# Patient Record
Sex: Female | Born: 1985 | Race: White | Hispanic: No | Marital: Single | Smoking: Former smoker
Health system: Southern US, Community
[De-identification: ages and names within clinical notes are randomized; demographics above are authoritative.]

## PROBLEM LIST (undated history)

## (undated) DIAGNOSIS — N289 Disorder of kidney and ureter, unspecified: Secondary | ICD-10-CM

## (undated) DIAGNOSIS — O139 Gestational [pregnancy-induced] hypertension without significant proteinuria, unspecified trimester: Secondary | ICD-10-CM

## (undated) DIAGNOSIS — M542 Cervicalgia: Secondary | ICD-10-CM

## (undated) DIAGNOSIS — B977 Papillomavirus as the cause of diseases classified elsewhere: Secondary | ICD-10-CM

## (undated) DIAGNOSIS — N200 Calculus of kidney: Secondary | ICD-10-CM

## (undated) DIAGNOSIS — M549 Dorsalgia, unspecified: Secondary | ICD-10-CM

## (undated) DIAGNOSIS — F209 Schizophrenia, unspecified: Secondary | ICD-10-CM

## (undated) DIAGNOSIS — F17200 Nicotine dependence, unspecified, uncomplicated: Secondary | ICD-10-CM

## (undated) DIAGNOSIS — R45851 Suicidal ideations: Secondary | ICD-10-CM

## (undated) DIAGNOSIS — R809 Proteinuria, unspecified: Secondary | ICD-10-CM

## (undated) HISTORY — PX: UPPER GI ENDOSCOPY: SHX6162

## (undated) HISTORY — DX: Proteinuria, unspecified: R80.9

## (undated) HISTORY — DX: Cervicalgia: M54.2

## (undated) HISTORY — PX: BREAST REDUCTION SURGERY: SHX8

## (undated) HISTORY — DX: Nicotine dependence, unspecified, uncomplicated: F17.200

## (undated) HISTORY — PX: LEEP: SHX91

## (undated) HISTORY — DX: Gestational (pregnancy-induced) hypertension without significant proteinuria, unspecified trimester: O13.9

## (undated) HISTORY — DX: Dorsalgia, unspecified: M54.9

## (undated) HISTORY — PX: WISDOM TOOTH EXTRACTION: SHX21

## (undated) HISTORY — DX: Suicidal ideations: R45.851

---
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
## 2007-01-01 ENCOUNTER — Ambulatory Visit: Payer: Self-pay | Admitting: Internal Medicine

## 2007-01-18 ENCOUNTER — Telehealth: Payer: Self-pay | Admitting: Internal Medicine

## 2007-01-21 ENCOUNTER — Ambulatory Visit (HOSPITAL_COMMUNITY): Payer: Self-pay | Admitting: Marriage and Family Therapist

## 2007-01-28 ENCOUNTER — Ambulatory Visit (HOSPITAL_COMMUNITY): Payer: Self-pay | Admitting: Marriage and Family Therapist

## 2007-02-11 ENCOUNTER — Ambulatory Visit (HOSPITAL_COMMUNITY): Payer: Self-pay | Admitting: Marriage and Family Therapist

## 2007-02-18 ENCOUNTER — Ambulatory Visit (HOSPITAL_COMMUNITY): Payer: Self-pay | Admitting: Marriage and Family Therapist

## 2007-02-25 ENCOUNTER — Ambulatory Visit (HOSPITAL_COMMUNITY): Payer: Self-pay | Admitting: Marriage and Family Therapist

## 2007-03-04 ENCOUNTER — Ambulatory Visit (HOSPITAL_COMMUNITY): Payer: Self-pay | Admitting: Marriage and Family Therapist

## 2007-03-15 ENCOUNTER — Ambulatory Visit (HOSPITAL_COMMUNITY): Payer: Self-pay | Admitting: Marriage and Family Therapist

## 2007-04-09 ENCOUNTER — Ambulatory Visit (HOSPITAL_COMMUNITY): Payer: Self-pay | Admitting: Marriage and Family Therapist

## 2007-04-22 ENCOUNTER — Ambulatory Visit (HOSPITAL_COMMUNITY): Payer: Self-pay | Admitting: Marriage and Family Therapist

## 2007-05-13 ENCOUNTER — Ambulatory Visit (HOSPITAL_COMMUNITY): Payer: Self-pay | Admitting: Marriage and Family Therapist

## 2007-05-24 ENCOUNTER — Ambulatory Visit (HOSPITAL_COMMUNITY): Payer: Self-pay | Admitting: Marriage and Family Therapist

## 2007-06-07 ENCOUNTER — Other Ambulatory Visit: Admission: RE | Admit: 2007-06-07 | Discharge: 2007-06-07 | Payer: Self-pay | Admitting: Gynecology

## 2007-10-01 ENCOUNTER — Ambulatory Visit (HOSPITAL_COMMUNITY): Payer: Self-pay | Admitting: Marriage and Family Therapist

## 2007-10-17 DIAGNOSIS — R45851 Suicidal ideations: Secondary | ICD-10-CM

## 2007-10-17 HISTORY — DX: Suicidal ideations: R45.851

## 2007-10-18 ENCOUNTER — Ambulatory Visit (HOSPITAL_COMMUNITY): Payer: Self-pay | Admitting: Marriage and Family Therapist

## 2007-10-23 ENCOUNTER — Ambulatory Visit (HOSPITAL_COMMUNITY): Payer: Self-pay | Admitting: Marriage and Family Therapist

## 2007-11-03 ENCOUNTER — Ambulatory Visit: Payer: Self-pay | Admitting: Psychiatry

## 2007-11-03 ENCOUNTER — Inpatient Hospital Stay (HOSPITAL_COMMUNITY): Admission: AD | Admit: 2007-11-03 | Discharge: 2007-11-10 | Payer: Self-pay | Admitting: Psychiatry

## 2007-11-11 ENCOUNTER — Other Ambulatory Visit (HOSPITAL_COMMUNITY): Admission: RE | Admit: 2007-11-11 | Discharge: 2007-11-26 | Payer: Self-pay | Admitting: Psychiatry

## 2007-11-15 ENCOUNTER — Telehealth (INDEPENDENT_AMBULATORY_CARE_PROVIDER_SITE_OTHER): Payer: Self-pay | Admitting: *Deleted

## 2007-11-19 ENCOUNTER — Telehealth: Payer: Self-pay | Admitting: Internal Medicine

## 2007-11-20 ENCOUNTER — Emergency Department (HOSPITAL_COMMUNITY): Admission: EM | Admit: 2007-11-20 | Discharge: 2007-11-20 | Payer: Self-pay | Admitting: Emergency Medicine

## 2007-11-20 ENCOUNTER — Telehealth: Payer: Self-pay | Admitting: Internal Medicine

## 2007-11-26 ENCOUNTER — Ambulatory Visit (HOSPITAL_COMMUNITY): Payer: Self-pay | Admitting: Psychiatry

## 2007-11-26 ENCOUNTER — Emergency Department (HOSPITAL_COMMUNITY): Admission: EM | Admit: 2007-11-26 | Discharge: 2007-11-26 | Payer: Self-pay | Admitting: Emergency Medicine

## 2007-12-09 ENCOUNTER — Encounter
Admission: RE | Admit: 2007-12-09 | Discharge: 2007-12-19 | Payer: Self-pay | Admitting: Physical Medicine & Rehabilitation

## 2007-12-10 ENCOUNTER — Ambulatory Visit: Payer: Self-pay | Admitting: Physical Medicine & Rehabilitation

## 2007-12-17 ENCOUNTER — Ambulatory Visit: Payer: Self-pay | Admitting: Internal Medicine

## 2007-12-17 DIAGNOSIS — M542 Cervicalgia: Secondary | ICD-10-CM

## 2007-12-17 DIAGNOSIS — F17200 Nicotine dependence, unspecified, uncomplicated: Secondary | ICD-10-CM

## 2007-12-17 DIAGNOSIS — Z9189 Other specified personal risk factors, not elsewhere classified: Secondary | ICD-10-CM | POA: Insufficient documentation

## 2007-12-17 HISTORY — DX: Nicotine dependence, unspecified, uncomplicated: F17.200

## 2007-12-17 HISTORY — DX: Cervicalgia: M54.2

## 2007-12-18 ENCOUNTER — Ambulatory Visit (HOSPITAL_COMMUNITY): Admission: RE | Admit: 2007-12-18 | Discharge: 2007-12-18 | Payer: Self-pay | Admitting: Orthopedic Surgery

## 2007-12-18 ENCOUNTER — Emergency Department (HOSPITAL_COMMUNITY): Admission: EM | Admit: 2007-12-18 | Discharge: 2007-12-18 | Payer: Self-pay | Admitting: Emergency Medicine

## 2007-12-19 ENCOUNTER — Ambulatory Visit: Payer: Self-pay | Admitting: Physical Medicine & Rehabilitation

## 2008-01-08 ENCOUNTER — Ambulatory Visit (HOSPITAL_COMMUNITY): Payer: Self-pay | Admitting: Psychiatry

## 2008-01-17 DIAGNOSIS — O139 Gestational [pregnancy-induced] hypertension without significant proteinuria, unspecified trimester: Secondary | ICD-10-CM

## 2008-01-17 HISTORY — DX: Gestational (pregnancy-induced) hypertension without significant proteinuria, unspecified trimester: O13.9

## 2008-02-10 ENCOUNTER — Telehealth: Payer: Self-pay | Admitting: Internal Medicine

## 2008-02-12 ENCOUNTER — Ambulatory Visit: Payer: Self-pay | Admitting: Women's Health

## 2008-06-12 ENCOUNTER — Ambulatory Visit (HOSPITAL_COMMUNITY): Payer: Self-pay | Admitting: Psychiatry

## 2008-09-18 ENCOUNTER — Ambulatory Visit (HOSPITAL_COMMUNITY): Payer: Self-pay | Admitting: Psychiatry

## 2009-01-22 ENCOUNTER — Ambulatory Visit: Payer: Self-pay | Admitting: Women's Health

## 2009-01-22 ENCOUNTER — Other Ambulatory Visit: Admission: RE | Admit: 2009-01-22 | Discharge: 2009-01-22 | Payer: Self-pay | Admitting: Gynecology

## 2009-02-10 ENCOUNTER — Ambulatory Visit: Payer: Self-pay | Admitting: Internal Medicine

## 2009-06-28 ENCOUNTER — Ambulatory Visit: Payer: Self-pay | Admitting: Women's Health

## 2009-10-06 ENCOUNTER — Ambulatory Visit: Payer: Self-pay | Admitting: Family Medicine

## 2009-12-07 ENCOUNTER — Ambulatory Visit: Payer: Self-pay | Admitting: Internal Medicine

## 2009-12-27 ENCOUNTER — Ambulatory Visit: Payer: Self-pay | Admitting: Women's Health
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
## 2010-02-20 ENCOUNTER — Emergency Department (HOSPITAL_COMMUNITY)
Admission: EM | Admit: 2010-02-20 | Discharge: 2010-02-20 | Disposition: A | Discharge status: Home / Self Care | Payer: 59 | Attending: Emergency Medicine | Admitting: Emergency Medicine

## 2010-02-20 ENCOUNTER — Emergency Department (HOSPITAL_COMMUNITY): Payer: 59

## 2010-02-20 DIAGNOSIS — F411 Generalized anxiety disorder: Secondary | ICD-10-CM | POA: Insufficient documentation

## 2010-02-20 DIAGNOSIS — E039 Hypothyroidism, unspecified: Secondary | ICD-10-CM | POA: Insufficient documentation

## 2010-02-20 DIAGNOSIS — R079 Chest pain, unspecified: Secondary | ICD-10-CM | POA: Insufficient documentation

## 2010-02-20 DIAGNOSIS — R002 Palpitations: Secondary | ICD-10-CM | POA: Insufficient documentation

## 2010-02-20 DIAGNOSIS — Z79899 Other long term (current) drug therapy: Secondary | ICD-10-CM | POA: Insufficient documentation

## 2010-02-20 LAB — POCT I-STAT, CHEM 8
BUN: 13 mg/dL (ref 6–23)
Calcium, Ion: 1.19 mmol/L (ref 1.12–1.32)
Chloride: 103 mEq/L (ref 96–112)
HCT: 44 % (ref 36.0–46.0)
Potassium: 3.2 mEq/L — ABNORMAL LOW (ref 3.5–5.1)

## 2010-02-20 LAB — D-DIMER, QUANTITATIVE: D-Dimer, Quant: <0.22 ug/mL-FEU (ref 0.00–0.48)

## 2010-02-23 ENCOUNTER — Inpatient Hospital Stay (INDEPENDENT_AMBULATORY_CARE_PROVIDER_SITE_OTHER)
Admission: RE | Admit: 2010-02-23 | Discharge: 2010-02-23 | Disposition: A | Discharge status: Home / Self Care | Payer: 59 | Source: Ambulatory Visit | Attending: Family Medicine | Admitting: Family Medicine

## 2010-02-23 DIAGNOSIS — R002 Palpitations: Secondary | ICD-10-CM

## 2010-03-09 ENCOUNTER — Ambulatory Visit (HOSPITAL_COMMUNITY)
Admission: RE | Admit: 2010-03-09 | Discharge: 2010-03-09 | Disposition: A | Discharge status: Home / Self Care | Payer: Self-pay | Source: Ambulatory Visit | Attending: Gastroenterology | Admitting: Gastroenterology

## 2010-03-09 ENCOUNTER — Other Ambulatory Visit: Payer: Self-pay | Admitting: Gastroenterology

## 2010-03-09 DIAGNOSIS — R1310 Dysphagia, unspecified: Secondary | ICD-10-CM | POA: Insufficient documentation

## 2010-03-09 DIAGNOSIS — K2970 Gastritis, unspecified, without bleeding: Secondary | ICD-10-CM | POA: Insufficient documentation

## 2010-03-09 DIAGNOSIS — Z79899 Other long term (current) drug therapy: Secondary | ICD-10-CM | POA: Insufficient documentation

## 2010-03-09 DIAGNOSIS — E039 Hypothyroidism, unspecified: Secondary | ICD-10-CM | POA: Insufficient documentation

## 2010-03-09 DIAGNOSIS — R0789 Other chest pain: Secondary | ICD-10-CM | POA: Insufficient documentation

## 2010-03-11 ENCOUNTER — Encounter: Payer: Self-pay | Admitting: Internal Medicine

## 2010-03-11 ENCOUNTER — Ambulatory Visit (INDEPENDENT_AMBULATORY_CARE_PROVIDER_SITE_OTHER): Payer: 59 | Admitting: Internal Medicine

## 2010-03-11 VITALS — BP 100/60 | HR 66 | Temp 98.3°F | Wt 116.0 lb

## 2010-03-11 DIAGNOSIS — F17200 Nicotine dependence, unspecified, uncomplicated: Secondary | ICD-10-CM

## 2010-03-11 DIAGNOSIS — J029 Acute pharyngitis, unspecified: Secondary | ICD-10-CM

## 2010-03-11 MED ORDER — PREDNISONE 20 MG PO TABS: 20.0000 mg | ORAL_TABLET | Freq: Every day | ORAL | Status: AC

## 2010-03-11 MED ORDER — FLUTICASONE PROPIONATE 50 MCG/ACT NA SUSP: 2.0000 | Freq: Every day | NASAL | Status: DC

## 2010-03-11 MED ORDER — AMOXICILLIN-POT CLAVULANATE 875-125 MG PO TABS: 1.0000 | ORAL_TABLET | Freq: Two times a day (BID) | ORAL | Status: AC

## 2010-03-11 NOTE — Patient Instructions (Signed)
Take 3-5 days of the prednisone to decrease inflammation  And the cortisone nose spray    To stay on to prevent sinus infections.  Can add the antibiotic if  Needed if not getting better after 3-5 days

## 2010-03-14 ENCOUNTER — Encounter: Payer: Self-pay | Admitting: Internal Medicine

## 2010-03-14 DIAGNOSIS — R109 Unspecified abdominal pain: Secondary | ICD-10-CM | POA: Insufficient documentation

## 2010-03-14 DIAGNOSIS — F329 Major depressive disorder, single episode, unspecified: Secondary | ICD-10-CM | POA: Insufficient documentation

## 2010-03-14 DIAGNOSIS — R002 Palpitations: Secondary | ICD-10-CM | POA: Insufficient documentation

## 2010-03-14 DIAGNOSIS — J329 Chronic sinusitis, unspecified: Secondary | ICD-10-CM | POA: Insufficient documentation

## 2010-03-14 DIAGNOSIS — F32A Depression, unspecified: Secondary | ICD-10-CM | POA: Insufficient documentation

## 2010-03-14 NOTE — Progress Notes (Signed)
  Subjective:    Patient ID: Amanda Chang, female    DOB: [birth year], 23 y.o.   MRN: [id number]  HPI Patient comes in today for acute problem .  She has had a stuffy nose for a while and now had increasing mucous and face pain.  Without fever cough or SOB.   She has been off and on tobacco . No sneezing or itching . Family memebers had beeen sicjk but she tends to keep her resp sx.   No asthma sx . No rx except saline.   Under eval for palpitations.  And  upper gi issues and had endo  ? Results .     Review of Systems Neg chest pain sweats fever .  Vomiting    Diarrhea.  Last sinus infection rx was in fall with augmentin and omnaris.  Rest as per hpi    Objective:   Physical Exam WDWN in nad with obvious nasal congestion looks almost allergic.   [place] tms clear eacs patent. naose no deformity  2+ turbinates  Face +- tender nasal area .   Op clear neck no masses shoddy nodes . No pc nodes Chest:  Clear to A&P without wheezes rales or rhonchi CV:  S1-S2 no gallops or murmurs peripheral perfusion is normal.   Oriented x 3. Normal cognition, attention, speech. Not anxious or depressed appearing   Good eye contact .           Assessment & Plan:  ? Sinusitis rhinitis    Poss allergic or vm    Trial and disc about using short course of pred and nasal steroid and add antibiotic if needed.   Disc prevention  Other  eval if    persistent or progressive  . Hs currently has no insurance which is limiting.   No tobacco.. pt aware.

## 2010-03-23 ENCOUNTER — Telehealth: Payer: Self-pay | Admitting: *Deleted

## 2010-03-23 NOTE — Telephone Encounter (Signed)
Pt called saying that she has round worms again and wants the medication that we treated her for. For the last few months, she has been having dizziness, fatigue and stomach swelling. She saw a GI md. Then she pooped out a worm. She got some medication 2.5 years ago. She thinks that they never went away. Her GI md said that he can only rx for the GI tract and can't give the one for the blood worms. Pt has to have a 1:30pm or after appt. She can't come in tomorrow am.

## 2010-03-25 NOTE — Telephone Encounter (Signed)
i reviewed  her old record and she was rx for pin worms . This instant  sounds different .   rec we try to get stool sample for  o and p .  Need info on what the worm looked like to decide on what med or intervention to do.  She can come in for lab only or ov in afternoon Monday.

## 2010-03-28 ENCOUNTER — Ambulatory Visit (INDEPENDENT_AMBULATORY_CARE_PROVIDER_SITE_OTHER): Payer: Medicaid Other | Admitting: Internal Medicine

## 2010-03-28 ENCOUNTER — Encounter: Payer: Self-pay | Admitting: Internal Medicine

## 2010-03-28 VITALS — BP 102/80 | Temp 98.5°F | Wt 112.0 lb

## 2010-03-28 DIAGNOSIS — Z8619 Personal history of other infectious and parasitic diseases: Secondary | ICD-10-CM

## 2010-03-29 NOTE — Telephone Encounter (Signed)
Pt came in for an appt

## 2010-03-31 ENCOUNTER — Telehealth: Payer: Self-pay | Admitting: *Deleted

## 2010-03-31 MED ORDER — ALBENDAZOLE 200 MG PO TABS: ORAL_TABLET | ORAL | Status: DC

## 2010-03-31 NOTE — Telephone Encounter (Signed)
I called Dr. Hulen Shouts office and spoke with Joni Reining. She said that the pinworm test was neg. They are going to fax this over to [place]. She also said that the last ov was 3/6. She was suppose to see [place] for headaches. They didn't tell her to call us for medication for worms in the blood. Pt was given mebendazole 100mg  1 and repeat in 2 weeks. They didn't have a dx for her. I called the pt and left her a voicemail to take one of the medications or the other but not both. I also called the pharmacy and told them to cancel the rx since Dr. Dulce Sellar has treated her for this already. I spoke to [place] at the pharmacy.

## 2010-03-31 NOTE — Telephone Encounter (Signed)
Pt states that she never received the rx. She has called the pharmacy and they don't have it. Rx sent to pharmacy

## 2010-04-08 NOTE — Telephone Encounter (Signed)
Got records  No reason for further worm therapy.  Her Headaches are not related .

## 2010-04-10 ENCOUNTER — Encounter: Payer: Self-pay | Admitting: Internal Medicine

## 2010-04-10 NOTE — Progress Notes (Signed)
  Subjective:    Patient ID: Amanda Chang, female    DOB: 1985/04/23, 25 y.o.   MRN: [id number]  HPI Pt comes in for acute visit  sda because she feels she needs to be trreated for worms. Has seen her gi for upper endo of rother sx . She reports  they told her to see [place]  But had given her a med rx  For  one kind of worrms . Worried about consitpation abd pain and HA no fever .  Saw small thread like worn and then another long one like a mucous that was congealed .    Records not avialbe at time of her visit . They did a stool of pinwirm pretp that was neg but caus of cost didn't do the repeat .Marland Kitchen She descries bloating and worry about worms  prep .    Review of Systems No fever weight loss vomiting blood in stool.  abd pain at times     Objective:   Physical Exam WDWN in nad looks well abd no masses or organomegaly        Note from 3/ 6  Dr Dulce Sellar showed a note and empiric  rx for pinworms .       Assessment & Plan:  Concern about worms   apparently already has empiric rx for pinworms  And g ahead and take this .will contact dr Out law office to check other n otes .   What she relates doesn't jive with previous note.   NO systemic sx .  But household should be rx  At a time .   HAs unrelated .   More than 50% of visit  Was spent in counseling  And obtaining other records  20 minutes

## 2010-04-19 ENCOUNTER — Telehealth: Payer: Self-pay | Admitting: *Deleted

## 2010-04-19 NOTE — Telephone Encounter (Signed)
Pt called back again. Crying wanting to know why we can't call in the rx. I explain that she needs to either collect the worms or call her GI md to see if they are willing to call in another rx. I told her that if she is not having any worms that the rx worked. But if has the symptoms again then she needs to collect the worm and we can send it out. Pt states that she understands. She keeps saying that someone at the GI office has told her one thing and then now they are telling her another and now she is having a hard time making decisions. I explain to her that she needs to let the office know what is going on and let them try to explain it to her so she can understand better about why they are doing what they are doing. Pt is okay with this and will call that office to see about this.

## 2010-04-19 NOTE — Telephone Encounter (Signed)
Pt saw a GI MD for intestional worms, and is having probs and wants [name] to take over her case.  Would like to speak to [facility].

## 2010-04-19 NOTE — Telephone Encounter (Signed)
Spoke to pt- she see the GI md for the worms and she felt better after taking the medications. She called the GI md about getting more pills because they didn't call in the whole rx. So she is now getting the run around about this. She wants to know if we can take it over from there. Pt was given mebendazole 100mg  2 tabs and she took one each day for 2 days but she was suppose to take 1 bid for 3 days. Pt is wanting to know if we can give her this rx. She has taken the 2 she had 2 weeks ago and then the one tab 1 week ago. She still has these worms and the pain has came back as well.

## 2010-04-19 NOTE — Telephone Encounter (Signed)
Per [name]- She did get the proper treatment according to the records we got from the GI md. If she is still having worms then she needs to collect it and bring it to either our office or the GI md. Pt aware of this and states that she didn't get the proper treatment but that she doesn't have the worms since she took the first dose. Pt is wanting [place] to call in the rx for twice a day for 3 days. I explained to pt that she did get the correct treatment but if she finds another worm she would need to bring it in for [place] to send out.

## 2010-04-20 NOTE — Telephone Encounter (Signed)
Review of records Had adequate treatment If has another episode bring in stool sample of the worm she sees.  Over treatment has risk.

## 2010-05-12 ENCOUNTER — Inpatient Hospital Stay (HOSPITAL_COMMUNITY)
Admission: RE | Admit: 2010-05-12 | Discharge: 2010-05-12 | Disposition: A | Discharge status: Home / Self Care | Payer: Self-pay | Source: Ambulatory Visit | Attending: Emergency Medicine | Admitting: Emergency Medicine

## 2010-05-12 ENCOUNTER — Inpatient Hospital Stay (INDEPENDENT_AMBULATORY_CARE_PROVIDER_SITE_OTHER)
Admission: RE | Admit: 2010-05-12 | Discharge: 2010-05-12 | Disposition: A | Discharge status: Home / Self Care | Payer: Medicaid Other | Source: Ambulatory Visit | Attending: Family Medicine | Admitting: Family Medicine

## 2010-05-12 DIAGNOSIS — E860 Dehydration: Secondary | ICD-10-CM

## 2010-05-12 DIAGNOSIS — K5289 Other specified noninfective gastroenteritis and colitis: Secondary | ICD-10-CM

## 2010-05-13 LAB — POCT URINALYSIS DIP (DEVICE)
Bilirubin Urine: NEGATIVE
Glucose, UA: NEGATIVE mg/dL
Ketones, ur: NEGATIVE mg/dL
Specific Gravity, Urine: 1.02 (ref 1.005–1.030)

## 2010-05-13 LAB — POCT PREGNANCY, URINE: Preg Test, Ur: NEGATIVE

## 2010-05-18 ENCOUNTER — Encounter: Payer: Self-pay | Admitting: Infectious Disease

## 2010-05-18 ENCOUNTER — Ambulatory Visit (INDEPENDENT_AMBULATORY_CARE_PROVIDER_SITE_OTHER): Payer: Medicaid Other | Admitting: Infectious Disease

## 2010-05-18 VITALS — BP 124/78 | HR 106 | Temp 98.0°F | Ht 61.0 in | Wt 116.0 lb

## 2010-05-18 DIAGNOSIS — F1910 Other psychoactive substance abuse, uncomplicated: Secondary | ICD-10-CM | POA: Insufficient documentation

## 2010-05-18 DIAGNOSIS — L259 Unspecified contact dermatitis, unspecified cause: Secondary | ICD-10-CM

## 2010-05-18 DIAGNOSIS — F329 Major depressive disorder, single episode, unspecified: Secondary | ICD-10-CM

## 2010-05-18 DIAGNOSIS — F40298 Other specified phobia: Secondary | ICD-10-CM

## 2010-05-18 DIAGNOSIS — B839 Helminthiasis, unspecified: Secondary | ICD-10-CM

## 2010-05-18 DIAGNOSIS — F22 Delusional disorders: Secondary | ICD-10-CM | POA: Insufficient documentation

## 2010-05-18 DIAGNOSIS — L309 Dermatitis, unspecified: Secondary | ICD-10-CM | POA: Insufficient documentation

## 2010-05-18 LAB — LACTATE DEHYDROGENASE: LDH: 141 U/L (ref 94–250)

## 2010-05-18 NOTE — Assessment & Plan Note (Signed)
It should not be very difficult to ascertain the condition were to notify us all of her symptoms. 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 I will check a struggle examined by and her serum but I think this is very unlikely. I will check a CMP CBC differential and HIV antibody as well as hepatitis panel and LDH. One could contemplate getting a CT of the abdomen and pelvis IV and oral contrast I think this is excessive and unnecessary. However this patient's is asking for any everything be done to workup her condition. I told her I will not over the stay scan this point in time I will coordinate with her other providers. I worry that continued ordering of tests by providers as only perpetuating her delusions of parasitic infection. 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 Be very difficult patient to treat I will see her one more time to review her labs but infectious disease has really no role in treating this patient.

## 2010-05-18 NOTE — Assessment & Plan Note (Signed)
I will screen her urine for drugs of abuse. She claims to not use any right now certainly patients who use methamphetamine or a bit prominent delusions of parasitosis themselves. Again she denies ever having used methamphetamines.
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
## 2010-05-18 NOTE — Assessment & Plan Note (Signed)
This is undoubtedly her underlying problem. I am skeptical that her psychiatrist suggested that a diagnosis of her parasitic infection or that her orthopedic surgeon did for that matter.

## 2010-05-19 LAB — COMPLETE METABOLIC PANEL WITH GFR
ALT: 20 U/L (ref 0–35)
Alkaline Phosphatase: 56 U/L (ref 39–117)
Creat: 0.71 mg/dL (ref 0.40–1.20)
GFR, Est African American: >60 mL/min (ref >60)
Sodium: 138 mEq/L (ref 135–145)
Total Bilirubin: 0.7 mg/dL (ref 0.3–1.2)
Total Protein: 6.9 g/dL (ref 6.0–8.3)

## 2010-05-19 LAB — HEPATITIS B CORE ANTIBODY, IGM: Hep B C IgM: NEGATIVE

## 2010-05-19 LAB — DRUG SCREEN, URINE
Amphetamine Screen, Ur: NEGATIVE
Barbiturate Quant, Ur: NEGATIVE
Benzodiazepines.: NEGATIVE
Marijuana Metabolite: POSITIVE — AB
Methadone: NEGATIVE
Propoxyphene: NEGATIVE

## 2010-05-19 LAB — HEPATITIS B SURFACE ANTIBODY,QUALITATIVE: Hep B S Ab: POSITIVE — AB

## 2010-05-19 LAB — HEPATITIS A ANTIBODY, TOTAL: Hep A Total Ab: NEGATIVE

## 2010-05-19 LAB — HEPATITIS B SURFACE ANTIGEN: Hepatitis B Surface Ag: NEGATIVE

## 2010-05-21 ENCOUNTER — Ambulatory Visit (HOSPITAL_COMMUNITY)
Admission: RE | Admit: 2010-05-21 | Discharge: 2010-05-21 | Disposition: A | Discharge status: Home / Self Care | Payer: Medicaid Other | Source: Ambulatory Visit | Attending: Psychiatry | Admitting: Psychiatry

## 2010-05-21 DIAGNOSIS — F39 Unspecified mood [affective] disorder: Secondary | ICD-10-CM | POA: Insufficient documentation

## 2010-05-21 LAB — STRONGYLOIDES ANTIBODY

## 2010-05-24 ENCOUNTER — Emergency Department (HOSPITAL_COMMUNITY)
Admission: EM | Admit: 2010-05-24 | Discharge: 2010-05-25 | Disposition: A | Discharge status: Other Acute Inpatient Hospital | Payer: Medicaid Other | Attending: Emergency Medicine | Admitting: Emergency Medicine

## 2010-05-24 ENCOUNTER — Ambulatory Visit: Payer: Self-pay

## 2010-05-24 DIAGNOSIS — F411 Generalized anxiety disorder: Secondary | ICD-10-CM | POA: Insufficient documentation

## 2010-05-24 DIAGNOSIS — E039 Hypothyroidism, unspecified: Secondary | ICD-10-CM | POA: Insufficient documentation

## 2010-05-24 DIAGNOSIS — F988 Other specified behavioral and emotional disorders with onset usually occurring in childhood and adolescence: Secondary | ICD-10-CM | POA: Insufficient documentation

## 2010-05-24 DIAGNOSIS — F22 Delusional disorders: Secondary | ICD-10-CM | POA: Insufficient documentation

## 2010-05-24 LAB — DIFFERENTIAL
Basophils Absolute: 0 10*3/uL (ref 0.0–0.1)
Basophils Relative: 0 % (ref 0–1)
Eosinophils Absolute: 0 10*3/uL (ref 0.0–0.7)
Monocytes Absolute: 0.4 10*3/uL (ref 0.1–1.0)
Monocytes Relative: 5 % (ref 3–12)
Neutro Abs: 5.8 10*3/uL (ref 1.7–7.7)
Neutrophils Relative %: 77 % (ref 43–77)

## 2010-05-24 LAB — RAPID URINE DRUG SCREEN, HOSP PERFORMED
Barbiturates: NOT DETECTED
Benzodiazepines: NOT DETECTED
Cocaine: NOT DETECTED
Opiates: NOT DETECTED

## 2010-05-24 LAB — URINE MICROSCOPIC-ADD ON

## 2010-05-24 LAB — URINALYSIS, ROUTINE W REFLEX MICROSCOPIC
Glucose, UA: NEGATIVE mg/dL
Protein, ur: 30 mg/dL — AB

## 2010-05-24 LAB — CBC
Hemoglobin: 15.1 g/dL — ABNORMAL HIGH (ref 12.0–15.0)
MCH: 32.3 pg (ref 26.0–34.0)
MCV: 90.8 fL (ref 78.0–100.0)
RBC: 4.67 MIL/uL (ref 3.87–5.11)

## 2010-05-24 LAB — COMPREHENSIVE METABOLIC PANEL
ALT: 19 U/L (ref 0–35)
Albumin: 4.3 g/dL (ref 3.5–5.2)
Alkaline Phosphatase: 56 U/L (ref 39–117)
BUN: 11 mg/dL (ref 6–23)
Chloride: 100 mEq/L (ref 96–112)
Glucose, Bld: 98 mg/dL (ref 70–99)
Potassium: 3.6 mEq/L (ref 3.5–5.1)
Sodium: 136 mEq/L (ref 135–145)
Total Bilirubin: 3.6 mg/dL — ABNORMAL HIGH (ref 0.3–1.2)

## 2010-05-25 ENCOUNTER — Inpatient Hospital Stay (HOSPITAL_COMMUNITY)
Admission: EM | Admit: 2010-05-25 | Discharge: 2010-06-03 | DRG: 885 | Disposition: A | Discharge status: Home / Self Care | Payer: PRIVATE HEALTH INSURANCE | Attending: Psychiatry | Admitting: Psychiatry

## 2010-05-25 DIAGNOSIS — F1210 Cannabis abuse, uncomplicated: Secondary | ICD-10-CM | POA: Diagnosis present

## 2010-05-25 DIAGNOSIS — E039 Hypothyroidism, unspecified: Secondary | ICD-10-CM | POA: Diagnosis present

## 2010-05-25 DIAGNOSIS — F29 Unspecified psychosis not due to a substance or known physiological condition: Secondary | ICD-10-CM

## 2010-05-25 DIAGNOSIS — F22 Delusional disorders: Principal | ICD-10-CM | POA: Diagnosis present

## 2010-05-27 LAB — URINALYSIS, ROUTINE W REFLEX MICROSCOPIC
Bilirubin Urine: NEGATIVE
Glucose, UA: NEGATIVE mg/dL
Hgb urine dipstick: NEGATIVE
Ketones, ur: NEGATIVE mg/dL
Protein, ur: NEGATIVE mg/dL
pH: 7 (ref 5.0–8.0)

## 2010-05-27 NOTE — H&P (Signed)
[name], Amanda Chang            ACCOUNT NO.:  [id number]  MEDICAL RECORD NO.:  [id number]           PATIENT TYPE:  I  LOCATION:  0404                          FACILITY:  [facility]  PHYSICIAN:  Eulogio Ditch, MD DATE OF BIRTH:  1985/10/07  DATE OF ADMISSION:  05/25/2010 DATE OF DISCHARGE:                      PSYCHIATRIC ADMISSION ASSESSMENT   REASON FOR ADMISSION:  Delusional behaviors.  HISTORY OF PRESENT ILLNESS:  A 25 year old white female who was admitted on IVC for delusional behavior.  The patient reported that ten years ago she started an experiment about hookworms that hookworms can cause Alzheimer's symptoms and psychiatric symptoms.  So, she thought about getting injected with hookworms and then she discussed with a number of doctors and then she finally found Dr. Virginia Rochester.  He was a doctor for body language expert.  She was able to convince him and then they found a doctor who could inject the hookworm.  Then, they also found a psychiatrist, [name], who will follow up with her.  She also reported that her boyfriend ten years ago also agreed with her on going ahead for this experiment, and the doctors agreed that after every ten years she has to be followed regularly.  She also reporteded that the psych ED and TV shows were created and the labs were created so that she can be easily monitored but, last week, all of the support system broke and she came to the ED to follow up on this but nobody believed her and discharged her because she said she is not suicidal or homicidal.  She reported that she is on Seroquel prescribed by [name] for the anxiety.  The patient denied hearing any voices, denied any suicidal or homicidal ideations.  SUBSTANCE ABUSE HISTORY:  The patient has history of abusing marijuana. She used two dime bags in the last week.  She also uses alcohol on and off.  PAST PSYCHIATRIC HISTORY:  The patient has been admitted at [facility] in  2009 and was diagnosed with major depressive disorder and was put on Seroquel.  She also has a history of cutting her wrists in the past--at that time she was in the ninth grade.  MEDICAL HISTORY:  History of hypothyroidism.  ALLERGIES:  ACETAMINOPHEN.  PHYSICAL EXAMINATION:  GENERAL:  Physical examination done in [facility] ED within normal limits. CARDIOVASCULAR:  Within normal limits. RESPIRATORY:  Bilaterally clear. ABDOMEN:  Nondistended, no organomegaly palpably present. NEUROLOGIC:  Cranial nerves II-XII intact.  Motor and sensory system intact.  LABORATORY DATA:  Within normal limits. Sodium 136, BUN 11, creatinine 0.72, AST 18, alcohol level less than 11.  Pregnancy test negative. UDS positive for amphetamines and tetrahydrocannabinols, that is marijuana.  SOCIAL HISTORY:  The patient lives with her mother, stepdad and a 2-year- old daughter.  The patient is currently not working or studying.  MENTAL STATUS EXAM:  The patient was very anxious but cooperative during the interview.  The patient was adamant for the discharge.  The patient was told that she will be here for a few days to control her anxiety. Eye contact fair.  No abnormal movements present.  Mood anxious.  Affect  mood congruent.  Thought process goal-directed but not logical.  Thought content not suicidal or homicidal. Somatic delusions positive regarding the hookworms.  Thought perception no audio or visual hallucinations reported, not internally preoccupied.  Cognition alert, awake, oriented x3.  Memory immediate, recent and remote fair.  Attention and concentration poor.  Abstraction ability fair.  Insight and judgment poor.  DIAGNOSES:  AXIS I:  Psychosis, not otherwise specified, rule out schizophrenia, paranoid type, rule out major depressive disorder with psychotic features, rule out delusional disorder, somatic type. AXIS II:  Deferred. AXIS III:  History of hypothyroidism, no active medical  issue. AXIS IV:  Chronic mental health issues. AXIS V:  Global Assessment of Functioning 30.  RECOMMENDATIONS: 1. The patient will be started on Seroquel 100 mg t.i.d. 2. The patient will be also given Valium 5 mg for three days to     control her anxiety. 3. We will try to contact the mother to get more information on the     patient.  At this time, the patient does not have the capacity to     give  consent to speak with the mother. 4. Estimated length of stay in the hospital will be five to seven     days.     Eulogio Ditch, MD     SA/MEDQ  D:  05/25/2010  T:  05/25/2010  Job:  [id number]  Electronically Signed by Eulogio Ditch  on 05/27/2010 09:46:52 AM
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
## 2010-06-03 LAB — T3, FREE: T3, Free: 2.4 pg/mL (ref 2.3–4.2)
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
## 2010-06-20 ENCOUNTER — Telehealth: Payer: Self-pay | Admitting: *Deleted

## 2010-06-20 NOTE — Telephone Encounter (Signed)
Wants Tomasita Morrow RN to call her to discuss her case

## 2010-06-21 ENCOUNTER — Ambulatory Visit (HOSPITAL_COMMUNITY)
Admission: RE | Admit: 2010-06-21 | Discharge: 2010-06-21 | Disposition: A | Discharge status: Home / Self Care | Payer: Medicaid Other | Attending: Psychiatry | Admitting: Psychiatry

## 2010-06-21 DIAGNOSIS — F22 Delusional disorders: Secondary | ICD-10-CM | POA: Insufficient documentation

## 2010-06-22 NOTE — Telephone Encounter (Signed)
Amanda Chang spoke with her yesterday afternoon

## 2010-06-25 NOTE — Discharge Summary (Signed)
Amanda Chang, Amanda Chang NO.:  [id number]  MEDICAL RECORD NO.:  [id number]  LOCATION:  0404                          FACILITY:  [facility]  PHYSICIAN:  Eulogio Ditch, MD DATE OF BIRTH:  01/25/1985  DATE OF ADMISSION:  05/25/2010 DATE OF DISCHARGE:  06/03/2010                              DISCHARGE SUMMARY   IDENTIFYING INFORMATION:  This is a 25 year old Caucasian female.  This is an involuntary admission.  HISTORY OF THE PRESENT ILLNESS:  This was the 2nd or 3rd [facility] admission for Amanda Chang who was previously on our unit in 2009 and diagnosed at that time with major depressive disorder.  On this occasion, she presents on referral from her psychiatrist, Dr. Geoffery Lyons, for an increase in delusional thinking, believing that hookworms have invaded her body and that they were intentionally planted there as part of a medical experiment many years ago.  MEDICAL EVALUATION AND DIAGNOSTIC STUDIES:  She was medically evaluated in the [facility] emergency room.  This is a small-built Caucasian female, normally-developed.  No abnormal movements.  Diagnostic studies were generally unremarkable.  BUN 11, creatinine 0.72.  Liver enzymes normal.  Pregnancy test negative.  Alcohol screen negative.  Urine drug screen positive for amphetamines and cannabis.  COURSE OF HOSPITALIZATION:  She was admitted to our acute stabilization unit and given a provisional diagnosis of psychosis NOS and started on Seroquel 100 mg p.o. t.i.d.  She did not have any history of chronic medical problems.  She was also started on Valium 5 mg p.o. 1 time daily for 3 days for control of anxiety.  She gave [place] permission to speak with her mother who was contacted by our case Production designer, theatre/television/film.  Her mother expressed concerns about gradual progression of the patient's mental illness.  She reported that she was prescribed Adderall, Seroquel and gabapentin as an outpatient.  They had found out from a friend of the  patient, that the patient had been snorting her Adderall.  This coincided with the decrease in sleep, pacing, going around in circles and expressing delusional thinking related to hookworms invading her body.  By May the 11th, she was less anxious but still expressing delusional thoughts. She was becoming able to participate more productively in group therapy by May the 12th. She took medications as prescribed with no signs of EPS or untoward effects.  We continued to work with medications and by the 15th, she was showing less delusional thinking and was much less paranoid.  We elected to increase her Seroquel to 300 mg once daily and increase the Valium to 5 mg q.h.s. every evening at bedtime and gabapentin 300 mg q.i.d. for her anxiety.  She complained that she was supposed to be taking a thyroid medication, but we were unable to validate that she had ever actually been prescribed this medication.  We performed a thyroid screen which showed a normal TSH of 1.123, a free T4 of 0.88 and a free T3 of 2.4 and reassured her that these findings were within normal limits and encouraged her to follow up with her primary physician.  Routine urinalysis was also unremarkable.  Throughout her stay, she was seriously writing journals and would spend hours all  day long writing lengthy journals and records of her thoughts and feelings and taped papers up on the wall with various schedules on them, elaborating her daily schedule in painstaking detail.  As her anxiety and paranoia decreased, so did her compulsive writing.  By the 18th, she was expressing readiness to go home and take care of her daughter who was at home.  We had some concerns about a previous female acquaintance with whom she had initially seemed inappropriately preoccupied, but she indicated that she had no plans to contact him and was grounded in reality that it was not appropriate to approach this person.  We eventually elected to  add Risperdal 2 mg p.o. q.h.s. and discontinued the Seroquel to address her continued complaints of anxiety, and this seems more efficacious.  She was calmer and seemed more grounded in reality and no signs of EPS.  By the 18th, she was ready for discharge.  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 Axis II:  No diagnosis. Axis III:  No acute medical problems. Axis IV:  Parenting stressors, social stressors. Axis V:  Current 55.  Past year not known.  DISCHARGE CONDITION:  Stable and improved.  DISCHARGE MEDICATIONS: 1. Diazepam 5 mg q.h.s. 2. Gabapentin 300 mg q.i.d. and gabapentin 600 mg q.h.s. 3. Risperidone 2 mg q.h.s. 4. Flonase nasal spray 2 sprays each nostril daily. 5. Vitamin B12 fifty mcg daily.  She is instructed to discontinue the Seroquel 150 mg q.h.s.  FOLLOWUP INSTRUCTIONS:  She is to follow up with Dr. Geoffery Lyons at his office on May the 25th at 11:00 a.m.     Amanda Chang, N.P.   ______________________________ Eulogio Ditch, MD    MAS/MEDQ  D:  06/21/2010  T:  06/21/2010  Job:  [id number]  Electronically Signed by Kari Baars N.P. on 06/23/2010 12:25:06 PM Electronically Signed by Eulogio Ditch  on 06/25/2010 05:57:00 PM

## 2010-07-13 ENCOUNTER — Inpatient Hospital Stay (HOSPITAL_COMMUNITY)
Admission: RE | Admit: 2010-07-13 | Discharge: 2010-07-15 | DRG: 885 | Disposition: A | Discharge status: Home / Self Care | Payer: PRIVATE HEALTH INSURANCE | Attending: Psychiatry | Admitting: Psychiatry

## 2010-07-13 ENCOUNTER — Encounter: Payer: Self-pay | Admitting: Infectious Disease

## 2010-07-13 ENCOUNTER — Ambulatory Visit (INDEPENDENT_AMBULATORY_CARE_PROVIDER_SITE_OTHER): Payer: Medicaid Other | Admitting: Infectious Disease

## 2010-07-13 DIAGNOSIS — F22 Delusional disorders: Secondary | ICD-10-CM

## 2010-07-13 DIAGNOSIS — F1910 Other psychoactive substance abuse, uncomplicated: Secondary | ICD-10-CM

## 2010-07-13 DIAGNOSIS — F29 Unspecified psychosis not due to a substance or known physiological condition: Secondary | ICD-10-CM

## 2010-07-13 DIAGNOSIS — F39 Unspecified mood [affective] disorder: Secondary | ICD-10-CM

## 2010-07-13 DIAGNOSIS — R45851 Suicidal ideations: Secondary | ICD-10-CM

## 2010-07-13 DIAGNOSIS — F40298 Other specified phobia: Secondary | ICD-10-CM

## 2010-07-13 DIAGNOSIS — E039 Hypothyroidism, unspecified: Secondary | ICD-10-CM

## 2010-07-13 DIAGNOSIS — F1994 Other psychoactive substance use, unspecified with psychoactive substance-induced mood disorder: Secondary | ICD-10-CM

## 2010-07-13 NOTE — Assessment & Plan Note (Signed)
The patient needs to followup with her psychiatrist [name] and continue supportive relationship with her primary care physician. Sending further diagnostic tests to workup parasites will not be helpful. Given further treatment with anti-home and take drugs will be also very unhelpful. Further visits with infectious disease doctors as myself or others will also be highly unlikely to be of any help. I have asked that she only be referred back to my clinic when there is a obvious infectious disease issue. 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

## 2010-07-13 NOTE — Assessment & Plan Note (Signed)
Her screen was positive for amphetamines although she says this is due to prescription Adderall which I could not see on her

## 2010-07-13 NOTE — Progress Notes (Signed)
Subjective:    Patient ID: Amanda Chang, female    DOB: Apr 14, 1985, 25 y.o.   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 I saw her more than a month ago my clinic. She then returned to clinic with active and paranoid delusions. She was ultimately admitted to behavioral health for treatment of her psychotic disorder. She is currently on antipsychotic medications. This visit was scheduled to discuss her laboratory data. I reviewed her labs with her and find absolutely 0 I repeated 0 and 9 no evidence of parasitic infection. Her diagnosis is that of delusional parasitosis. There is ancillary no evidence of any  infectious disease for which she should be seen in this clinic. While talking to her she does persist in holding to her delusion that she has a warm infection. I've informed her that I have found no evidence for warm infection based on exam based on laboratory testing and have reminded her that she is only been treated several times with highly effective and traumatic therapy despite the fact that there is no evidence for a parasitic infection. She insists that there is evidence that she is infected and that there is a conspiracy to cover up this infection. I asked her she was having any thoughts of harm herself she denied this and she contracted for safety. She then asked to see my nurse to be sent also Nurse Tammy. I've encouraged her to see her psychiatrist [name] as soon as possible. I have informed her not going to be seeing her for further workup for parasites as I find no evidence for their existence in her. [name] [facility])  [name]  Review of Systems  Constitutional: Positive for activity change, appetite change and fatigue. Negative for fever, chills, diaphoresis and unexpected weight change.  HENT: Positive for sore throat, trouble swallowing and neck pain. Negative for congestion, rhinorrhea, sneezing and sinus pressure.   Eyes:  Negative for photophobia and visual disturbance.  Respiratory: Negative for cough, chest tightness, shortness of breath, wheezing and stridor.   Cardiovascular: Negative for chest pain, palpitations and leg swelling.  Gastrointestinal: Positive for nausea, abdominal pain, diarrhea, constipation and abdominal distention. Negative for vomiting, blood in stool and anal bleeding.  Genitourinary: Negative for dysuria, hematuria, flank pain and difficulty urinating.  Musculoskeletal: Positive for back pain and arthralgias. Negative for myalgias, joint swelling and gait problem.  Skin: Positive for color change and rash. Negative for pallor and wound.  Neurological: Negative for dizziness, tremors, weakness and light-headedness.  Hematological: Negative for adenopathy. Does not bruise/bleed easily.  Psychiatric/Behavioral: Positive for behavioral problems and agitation. Negative for confusion, sleep disturbance, self-injury, dysphoric mood and decreased concentration.       Objective:   Physical Exam  Constitutional: She is oriented to person, place, and time. She appears well-developed and well-nourished. No distress.  HENT:  Head: Normocephalic and atraumatic.  Mouth/Throat: Oropharynx is clear and moist. No oropharyngeal exudate.  Eyes: Conjunctivae and EOM are normal. Pupils are equal, round, and reactive to light. No scleral icterus.  Neck: Normal range of motion. Neck supple. No JVD present.  Cardiovascular: Normal rate, regular rhythm and normal heart sounds.  Exam reveals no gallop and no friction rub.   No murmur heard. Pulmonary/Chest: Effort normal and breath sounds normal. No respiratory distress. She has no wheezes. She exhibits no tenderness.  Abdominal: Soft. She exhibits no distension.  Musculoskeletal: She exhibits no edema.  Lymphadenopathy:    She has no cervical  adenopathy.  Neurological: She is alert and oriented to person, place, and time. She has normal reflexes. She  exhibits normal muscle tone. Coordination normal.  Skin: Skin is warm and dry. No rash noted. She is not diaphoretic. No erythema. No pallor.  Psychiatric: Her mood appears anxious. Her speech is tangential. Her speech is not delayed and not slurred. She is not aggressive, is not hyperactive, not slowed, not withdrawn, not actively hallucinating and not combative. Thought content is paranoid and delusional. Cognition and memory are not impaired. She does not express impulsivity or inappropriate judgment. She exhibits a depressed mood. She expresses no homicidal and no suicidal ideation. She expresses no suicidal plans and no homicidal plans. She is communicative. She exhibits normal recent memory and normal remote memory. She is attentive.          Assessment & Plan:  Delusions of parasitosis The patient needs to followup with her psychiatrist [name] and continue supportive relationship with her primary care physician. Sending further diagnostic tests to workup parasites will not be helpful. Given further treatment with anti-home and take drugs will be also very unhelpful. Further visits with infectious disease doctors as myself or others will also be highly unlikely to be of any help. I have asked that she only be referred back to my clinic when there is a obvious infectious disease issue. 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

## 2010-07-14 DIAGNOSIS — F22 Delusional disorders: Secondary | ICD-10-CM

## 2010-07-14 LAB — CBC
HCT: 40.5 % (ref 36.0–46.0)
Hemoglobin: 13.7 g/dL (ref 12.0–15.0)
MCV: 94.6 fL (ref 78.0–100.0)
RBC: 4.28 MIL/uL (ref 3.87–5.11)
RDW: 12.8 % (ref 11.5–15.5)
WBC: 6.6 10*3/uL (ref 4.0–10.5)

## 2010-07-14 LAB — COMPREHENSIVE METABOLIC PANEL
Albumin: 3.6 g/dL (ref 3.5–5.2)
Alkaline Phosphatase: 59 U/L (ref 39–117)
BUN: 19 mg/dL (ref 6–23)
CO2: 25 mEq/L (ref 19–32)
Chloride: 101 mEq/L (ref 96–112)
Creatinine, Ser: 0.57 mg/dL (ref 0.50–1.10)
GFR calc non Af Amer: >60 mL/min (ref >60)
Potassium: 3.7 mEq/L (ref 3.5–5.1)
Total Bilirubin: 0.6 mg/dL (ref 0.3–1.2)

## 2010-07-21 ENCOUNTER — Encounter: Payer: Self-pay | Admitting: Family Medicine

## 2010-07-21 ENCOUNTER — Ambulatory Visit (INDEPENDENT_AMBULATORY_CARE_PROVIDER_SITE_OTHER): Payer: Medicaid Other | Admitting: Family Medicine

## 2010-07-21 DIAGNOSIS — J019 Acute sinusitis, unspecified: Secondary | ICD-10-CM

## 2010-07-21 MED ORDER — HYDROCODONE-HOMATROPINE 5-1.5 MG/5ML PO SYRP: 5.0000 mL | ORAL_SOLUTION | Freq: Four times a day (QID) | ORAL | Status: DC | PRN

## 2010-07-21 MED ORDER — AMOXICILLIN-POT CLAVULANATE 875-125 MG PO TABS: 1.0000 | ORAL_TABLET | Freq: Two times a day (BID) | ORAL | Status: AC

## 2010-07-21 NOTE — Progress Notes (Signed)
  Subjective:    Patient ID: Amanda Chang, female    DOB: [birth year], 25 y.o.   MRN: [id number]  HPI History frequent sinus problems. One-month history frontal sinus pressure. Using decongestant and Flonase without much improvement. Frequent bloody and sometimes yellowish nasal discharge. Intermittent headaches. No fever. Associated dry cough at night. Previously has responded to Augmentin. Reported drug allergy with acetaminophen. Patient does smoke. History of chronic anxiety   Review of Systems As per HPI.    Objective:   Physical Exam  Constitutional: She appears well-developed and well-nourished.  HENT:  Right Ear: External ear normal.  Left Ear: External ear normal.  Mouth/Throat: Oropharynx is clear and moist. No oropharyngeal exudate.       Patient has minimal dried blood right naris and generally swollen mucosa throughout right and left side  Neck: Neck supple.  Pulmonary/Chest: Effort normal and breath sounds normal. No respiratory distress. She has no wheezes. She has no rales.  Lymphadenopathy:    She has no cervical adenopathy.          Assessment & Plan:  Recurrent versus chronic sinusitis. Augmentin 875 mg twice daily for 14 days. Refill Hycodan for cough suppression

## 2010-07-25 NOTE — H&P (Signed)
Amanda, [name] NO.:  [id number]  MEDICAL RECORD NO.:  [id number]  LOCATION:  0507                          FACILITY:  [facility]  PHYSICIAN:  Franchot Gallo, MD     DATE OF BIRTH:  [birth year]  DATE OF ADMISSION:  07/13/2010 DATE OF DISCHARGE:                      PSYCHIATRIC ADMISSION ASSESSMENT   HISTORY OF PRESENT ILLNESS:  This is a 25 year old single white female. Amanda Chang was just with Amanda Chang.  She was here 05/09 to 05/18.  This was the second or third admission.  She still maintains that she was infected with hook worms, she is still suffering from delusions.  She was seen earlier today by [name], apparently, this was her infectious disease doctor, and he informed her that he has found no evidence for a worm infection based on laboratory testing and had reminded her that she has only been treated several times with highly effective and traumatic therapy despite the fact that there is no evidence for a parasitic infection.  She insists that there is evidence that she is infected and that there is a conspiracy to cover up this infection.  I asked sure whether she was having any thoughts of harming herself.  She denied this.  She contracted for safety.  She was encouraged to see her outside private psychiatrist, [name] as soon as possible, and I have informed her that I am not going to be seeing her for further workup for parasites as I find no existence for their existence in her.  A copy was sent to [name], apparently her primary care at [facility] and also to [name].  She was seen earlier today.  Apparently, her drug screen was positive for amphetamines.  She says this is due to a prescription for Adderall, which I could not see on her.  She presented as a walk-in.  PAST PSYCHIATRIC HISTORY:  As already stated, she has been to the [facility] on and off since 2009.  She is followed on an outpatient basis by Dr.  Dub Mikes.  SOCIAL HISTORY:  She reports having attended [address] [place] for 3 years.  She states she lives here in [place] with her mother.  FAMILY HISTORY:  Negative.  ALCOHOL AND DRUG HISTORY:  She reports a prescription for Adderall.  We do not have verification for that yet.  Her primary care provider is [name] at [facility].  MEDICAL PROBLEMS:  Hypothyroidism.  MEDICATIONS:  Medications at the time of her last discharge: 1. Diazepam 5 mg at bedtime. 2. Flonase is very daily. 3. Gabapentin 300 mg q.i.d., 600 mg at bedtime. 4. Risperidone 2 mg at bedtime. 5. Vitamin B12 p.o. daily in the a.m.  She states that since her discharge she has been seen on an outpatient basis by [name] and had some medication adjustments.  She is agreeable to the morning when we can verify what her current medications are to make an adjustment.  DRUG ALLERGIES:  Acetaminophen makes her have a rash.  POSITIVE PHYSICAL FINDINGS:  This is a 25 year old well-developed, well- nourished female who is easily excitable at the moment.  She states, "I am not delusional."  She was casually groomed  and dressed.  Her mood is quite labile.  She is quite emotional.  Her thought processes can be redirected.  She states she is in a loop, that to get out of this loop she has to the talk to Dr. Beaulah Dinning.  Judgment and insight are poor.  Concentration and memory are superficially intact.  Intelligence is at least average.  ADMISSION DIAGNOSES:   AXIS I:  Psychosis NOS.   Rule out substance-induced psychosis, mood disorder NOS. AXIS II:  No diagnosis. AXIS III:  No acute medical problems, although she is now reporting hypothyroidism. AXIS IV:  Parenting stressors, social stressors. AXIS V:  45.  PLAN:  Plan is to admit for safety and stabilization.  According to her last discharge summary, the patient has been snorting her Adderall in the past.  This coincided with the decrease in sleep, pacing,  going around in circles and expressing delusional thinking related to hook worms.  I am not sure if she is still actually being prescribed amphetamines or if she is just abusing them.     [name], P.A.-C.   ______________________________ Franchot Gallo, MD    MD/MEDQ  D:  07/13/2010  T:  07/14/2010  Job:  [id number]  Electronically Signed by Jaci Lazier ADAMS P.A.-C. on 07/23/2010 09:48:21 AM Electronically Signed by Franchot Gallo MD on 07/25/2010 07:43:42 AM

## 2010-08-01 ENCOUNTER — Telehealth: Payer: Self-pay | Admitting: *Deleted

## 2010-08-01 NOTE — Discharge Summary (Signed)
  [name], Amanda Chang NO.:  [id number]  MEDICAL RECORD NO.:  [id number]  LOCATION:  0507                          FACILITY:  [facility]  PHYSICIAN:  Franchot Gallo, MD     DATE OF BIRTH:  03-05-1985  DATE OF ADMISSION:  07/13/2010 DATE OF DISCHARGE:  07/15/2010                              DISCHARGE SUMMARY   REASON FOR ADMISSION:  The patient was admitted for depression, anxiety and suicidal thoughts related to her delusional disorder, which she has had for 10 years.  She wanted to resume the medications that were prescribed by her primary psychiatrist.  The patient was upset that she was told by the infectious disease doctor that there was no evidence of any worm infection, that was her delusional thinking.  The patient was insisting that there was evidence that she is infected and there was a conspiracy to cover up the infection.  FINAL DIAGNOSES:  Axis I:  Delusional disorder, somatic type.  Rule out substance-induced psychosis.  Mood disorder, not otherwise specified. Axis II:  No diagnosis. Axis III:  No acute medical problems although she was reporting hypothyroidism. Axis IV:  Parenting stressors, social stressors. Axis V:  50-55.  The patient was admitted to the adult milieu.  We continued with the medications that she felt were helpful.  The patient was reporting good sleep and good appetite, having moderate depressive symptoms, rating it a 5, hopelessness at a 10.  Denied any suicidal thoughts but did have some on admission, having delusional thinking, having mild to moderate anxiety.  We continued her medications as were prescribed on the outside, added Lexapro.  The patient was attending groups.  On day of discharge the patient was seen in the interdisciplinary team.  She was fully alert and bright.  She was reporting good sleep, good appetite, having mild depressive symptoms rating it as a 3 adamantly denied any suicidal or homicidal thoughts or  auditory or visual hallucinations. She continued with fixed delusions of the hookworms.  She denied any medication side effects.  The patient was stable for aftercare.  DISCHARGE MEDICATIONS: 1. Lexapro 10 mg daily. 2. Flonase nasal spray as needed. 3. Seroquel 25 mg taking 3 tablets q.h.s. 4. Fish oil as needed. 5. Gabapentin 300 mg taken 1 q.i.d. at 6, 2, 6 and 10. 6. Multivitamin. 7. Valium 5 mg at 6, 10 and 2. 8. Vitamin B12 daily. 9. Zyprexa 50 mg b.i.d.  Her follow-up appointment was with [facility] and [name] on July 9 at 2:40,phone number [phone].     Landry Corporal, N.P.   ______________________________ Franchot Gallo, MD    JO/MEDQ  D:  07/15/2010  T:  07/16/2010  Job:  [id number]  Electronically Signed by Limmie PatriciaP. on 08/01/2010 11:33:45 AM Electronically Signed by Franchot Gallo MD on 08/01/2010 12:03:23 PM

## 2010-08-01 NOTE — Telephone Encounter (Signed)
Pt called saying that she saw Dr. Caryl Never and he extended her antibiotic for 14 days. She does have a few days left. She states that she still has mucous stuck in her throat and can't get it out. She is still having a sinus pressure and headache. She doesn't have much cough medication left and would like a refill on it. Pt wants to know what the next step is. She still has no insurance.

## 2010-08-01 NOTE — Telephone Encounter (Signed)
Hard to give advice as haven't seen her recently for this.

## 2010-08-02 NOTE — Telephone Encounter (Signed)
May refill Hycodan once.  She should still be finishing up antibiotic-prescribed for 14 days.  If no better after completing antibiotic get limited CT of sinuses.

## 2010-08-03 MED ORDER — HYDROCODONE-HOMATROPINE 5-1.5 MG/5ML PO SYRP: 5.0000 mL | ORAL_SOLUTION | Freq: Four times a day (QID) | ORAL | Status: AC | PRN

## 2010-08-03 NOTE — Telephone Encounter (Signed)
Left message for pt on machine and also called in rx to pharmacy.

## 2010-08-08 ENCOUNTER — Telehealth: Payer: Self-pay | Admitting: Internal Medicine

## 2010-08-08 DIAGNOSIS — J329 Chronic sinusitis, unspecified: Secondary | ICD-10-CM

## 2010-08-08 NOTE — Telephone Encounter (Signed)
Would recommend limited CT of sinuses to evaluate.

## 2010-08-08 NOTE — Telephone Encounter (Signed)
Pt called and said that she was suppose to be getting a referral to a spec re: sinuses. Pt said that she now has Medicaid, so what ever referral she gets, needs to accept [employer].

## 2010-08-09 NOTE — Telephone Encounter (Signed)
Pt informed referral has been made

## 2010-08-12 ENCOUNTER — Ambulatory Visit
Admission: RE | Admit: 2010-08-12 | Discharge: 2010-08-12 | Disposition: A | Discharge status: Home / Self Care | Payer: Medicaid Other | Source: Ambulatory Visit | Attending: Family Medicine | Admitting: Family Medicine

## 2010-08-12 DIAGNOSIS — J329 Chronic sinusitis, unspecified: Secondary | ICD-10-CM

## 2010-08-15 ENCOUNTER — Telehealth: Payer: Self-pay | Admitting: *Deleted

## 2010-08-15 DIAGNOSIS — J329 Chronic sinusitis, unspecified: Secondary | ICD-10-CM

## 2010-08-15 NOTE — Telephone Encounter (Signed)
Message copied by Trenton Gammon on Mon Aug 15, 2010  3:18 PM ------      Message from: Kristian Covey      Created: Mon Aug 15, 2010  8:48 AM       NO chronic sinusitis changes noted. She does have some septal deviation which may contribute to some of her nasal congestion symptoms.  If symptoms persist of nasal congestion would consider ENT referral and she is encouraged to follow up with her primary to discuss.

## 2010-08-15 NOTE — Telephone Encounter (Signed)
Addended by: Romualdo Bolk on: 08/15/2010 04:43 PM   Modules accepted: Orders

## 2010-08-15 NOTE — Telephone Encounter (Signed)
Order sent to [facility] 

## 2010-08-15 NOTE — Telephone Encounter (Signed)
patient  Is aware of results and still has nasal congestion and would like the referral to ENT.

## 2010-08-15 NOTE — Telephone Encounter (Signed)
Ok to do ENT referral. See Dr Lucie Leather note and advice

## 2010-08-26 ENCOUNTER — Telehealth: Payer: Self-pay | Admitting: Internal Medicine

## 2010-08-26 NOTE — Telephone Encounter (Signed)
Left message on machine to check ins card to see if it has another md name on it. If so, she needs to contact them because we don't take [place] access. Otherwise, she doesn't need a referral.

## 2010-08-26 NOTE — Telephone Encounter (Signed)
Pt needs referral to see OGYN Dr Maryelizabeth Rowan today, in order to get morning after pill. Pt insurance changed to medicaid so she has to get referral in order to be seen today. Pt has to get this today.

## 2010-08-30 ENCOUNTER — Telehealth: Payer: Self-pay | Admitting: Internal Medicine

## 2010-08-30 NOTE — Telephone Encounter (Signed)
We do not take [place] [facility]. Pt must call the name of the provider on her card to get this referral. Pt aware of this.

## 2010-08-30 NOTE — Telephone Encounter (Signed)
Pt has [place] [facility] and needs a referral to go to a gynecologist. Pt would like to be referrd to Dr. Maryelizabeth Rowan the contact number is [phone]. please contact pt.

## 2010-09-03 ENCOUNTER — Inpatient Hospital Stay (HOSPITAL_COMMUNITY)
Admission: RE | Admit: 2010-09-03 | Discharge: 2010-09-12 | DRG: 885 | Disposition: A | Discharge status: Home / Self Care | Payer: Medicaid Other | Attending: Psychiatry | Admitting: Psychiatry

## 2010-09-03 DIAGNOSIS — Z79899 Other long term (current) drug therapy: Secondary | ICD-10-CM

## 2010-09-03 DIAGNOSIS — IMO0002 Reserved for concepts with insufficient information to code with codable children: Secondary | ICD-10-CM

## 2010-09-03 DIAGNOSIS — Z6379 Other stressful life events affecting family and household: Secondary | ICD-10-CM

## 2010-09-03 DIAGNOSIS — F200 Paranoid schizophrenia: Principal | ICD-10-CM

## 2010-09-03 DIAGNOSIS — Z818 Family history of other mental and behavioral disorders: Secondary | ICD-10-CM

## 2010-09-04 DIAGNOSIS — F22 Delusional disorders: Secondary | ICD-10-CM

## 2010-09-04 LAB — DRUGS OF ABUSE SCREEN W/O ALC, ROUTINE URINE
Amphetamine Screen, Ur: POSITIVE — AB
Benzodiazepines.: POSITIVE — AB
Creatinine,U: 246 mg/dL
Marijuana Metabolite: NEGATIVE
Opiate Screen, Urine: NEGATIVE
Phencyclidine (PCP): NEGATIVE
Propoxyphene: NEGATIVE

## 2010-09-06 LAB — LIPID PANEL: LDL Cholesterol: 88 mg/dL (ref 0–99)

## 2010-09-06 LAB — TSH: TSH: 4.63 u[IU]/mL — ABNORMAL HIGH (ref 0.350–4.500)

## 2010-09-06 LAB — HEMOGLOBIN A1C: Mean Plasma Glucose: 111 mg/dL (ref <117)

## 2010-09-07 LAB — BENZODIAZEPINE, QUANTITATIVE, URINE
Alprazolam (GC/LC/MS), ur confirm: NEGATIVE NG/ML
Flurazepam GC/MS Conf: NEGATIVE NG/ML
Nordiazepam GC/MS Conf: 507 NG/ML — ABNORMAL HIGH
Temazepam GC/MS Conf: 3529 NG/ML — ABNORMAL HIGH

## 2010-09-07 LAB — AMPHETAMINES URINE CONFIRMATION
Amphetamines: 13062 NG/ML — ABNORMAL HIGH
Methamphetamine GC/MS, Ur: NEGATIVE NG/ML

## 2010-09-09 LAB — URINE MICROSCOPIC-ADD ON

## 2010-09-09 LAB — URINALYSIS, ROUTINE W REFLEX MICROSCOPIC
Glucose, UA: NEGATIVE mg/dL
Leukocytes, UA: NEGATIVE
pH: 6 (ref 5.0–8.0)

## 2010-09-13 NOTE — Discharge Summary (Signed)
NAMEDARNETTE, LAMPRON NO.:  [id number]  MEDICAL RECORD NO.:  [id number]  LOCATION:  0401                          FACILITY:  [facility]  PHYSICIAN:  Eulogio Ditch, MD DATE OF BIRTH:  1985-11-10  DATE OF ADMISSION:  09/03/2010 DATE OF DISCHARGE:  09/12/2010                              DISCHARGE SUMMARY   HISTORY OF PRESENT ILLNESS:  25 year old Caucasian female who was a long history of delusional disorder about infection with hook worms.  The patient has this delusion that 10-12 years ago Dr.ekman did an experiment, and she was injected with hook worms and those hook worms are still there, and she has to find Dr. Angus Seller who can remove her hook worms.  HOSPITAL COURSE:  During the hospital stay, the patient was initially started on Geodon, but on further feedback from [name], who is the primary psychiatrist for the patient, the patient was started on Abilify 5 mg twice a day along with Lexapro 10 mg p.o. daily, Neurontin 300 mg daily, 600 at bedtime and 600 around 6:00 p.m.  The patient responded to this combination well without any side effects.  Her delusional thinking is still there, but her insight has improved.  The patient at this time has no acute psychotic symptoms.  She is not dangerous to herself.  She is not hallucinating, not having any suicidal or homicidal ideations. The patient is going to live with the mother after discharge from the hospital.  On September 12, 2010, her mental status is improved, is not suicidal or homicidal, not hallucinating.  Her delusional thinking is still there, but it will take time slowly with the medication to resolve.  Throughout the stay in the hospital, the patient remained compliant with her medications.  The patient during one time fell while coming up from the floor, and she complained of back pain, but there were no bruises at the back.  She complained of back pain all over the back, it was not to one point, and  it was not radiating.  The patient was advised to follow up in the outpatient setting with the primary doctor.  The patient wrote a 72-hour letter for discharge as the patient's mental status is within normal range and she is not a danger to herself or other people.  The patient will be discharged at this time.  DISCHARGE DIAGNOSES:  Axis I:  Delusional disorder, schizophrenia paranoid type. Axis II:  Deferred. Axis III:  No active medical issue. Axis IV:  Chronic mental health issues. Axis V:  50.  DISCHARGE FOLLOWUP:  The patient will follow with [name] on Wednesday.  DISCHARGE MEDICATIONS: 1. Abilify 5 mg a.m. and 6:00 p.m. 2. Valium 5 mg q.24 hours. 3. Lexapro 10 mg p.o. daily. 4. Neurontin 300 mg p.o. daily, 600 at bedtime and 600 at 6:00 p.m. 5. Seroquel 50 mg at bedtime for insomnia. 6. Naproxen 500 mg twice a day for the back pain. 7. Claritin 10 mg p.o. daily. 8. Adderall 10 mg in the morning and 5 mg around 2 p.m.     Eulogio Ditch, MD     SA/MEDQ  D:  09/12/2010  T:  09/12/2010  Job:  [id number]  Electronically Signed by Eulogio Ditch  on 09/13/2010 01:58:30 PM

## 2010-10-18 LAB — CBC
Hemoglobin: 14.7
RBC: 4.38

## 2010-10-18 LAB — URINE MICROSCOPIC-ADD ON

## 2010-10-18 LAB — URINE DRUGS OF ABUSE SCREEN W ALC, ROUTINE (REF LAB)
Amphetamine Screen, Ur: NEGATIVE
Barbiturate Quant, Ur: NEGATIVE
Benzodiazepines.: NEGATIVE
Benzodiazepines.: NEGATIVE
Cocaine Metabolites: NEGATIVE
Ethyl Alcohol: <10
Methadone: NEGATIVE
Opiate Screen, Urine: NEGATIVE
Phencyclidine (PCP): NEGATIVE
Phencyclidine (PCP): NEGATIVE

## 2010-10-18 LAB — DRUGS OF ABUSE SCREEN W/O ALC, ROUTINE URINE
Amphetamine Screen, Ur: NEGATIVE
Benzodiazepines.: NEGATIVE
Opiate Screen, Urine: NEGATIVE
Phencyclidine (PCP): NEGATIVE
Propoxyphene: NEGATIVE

## 2010-10-18 LAB — COMPREHENSIVE METABOLIC PANEL
ALT: 15
Alkaline Phosphatase: 62
CO2: 25
Chloride: 106
GFR calc non Af Amer: >60
Glucose, Bld: 89
Potassium: 3.4 — ABNORMAL LOW
Sodium: 139

## 2010-10-18 LAB — URINALYSIS, ROUTINE W REFLEX MICROSCOPIC
Bilirubin Urine: NEGATIVE
Bilirubin Urine: NEGATIVE
Ketones, ur: NEGATIVE
Nitrite: NEGATIVE
Protein, ur: NEGATIVE
Specific Gravity, Urine: 1.026
Specific Gravity, Urine: 1.018
Urobilinogen, UA: 1
Urobilinogen, UA: 0.2

## 2010-10-18 LAB — THC (MARIJUANA), URINE, CONFIRMATION: Marijuana, Ur-Confirmation: 34 ng/mL

## 2010-10-18 LAB — TSH: TSH: 1.937

## 2010-11-22 NOTE — Assessment & Plan Note (Signed)
Amanda Chang, LANPHEAR NO.:  [id number]  MEDICAL RECORD NO.:  [id number]  LOCATION:  0401                          FACILITY:  [facility]  PHYSICIAN:  Geoffery Lyons, M.D.      DATE OF BIRTH:  10-09-1985  DATE OF ADMISSION:  09/03/2010 DATE OF DISCHARGE:  09/12/2010                      PSYCHIATRIC ADMISSION ASSESSMENT   DATE OF ASSESSMENT:  September 04, 2010, at 12:30 a.m.  IDENTIFYING INFORMATION:  A 25 year old female.  This is a voluntary admission.  HISTORY OF THE PRESENT ILLNESS:  Amanda Chang presents with difficulty of distinguishing reality from delusions.  She reports she has difficulty telling what is real and was not real.  Admits that she is living in two different realities.  The reality that she believes in is that she underwent an experiment where somebody put worms inside of her body, affecting the functioning of her mind.  Yet, she is being told that that is not real.  She gets upset and feels paralyzed between these two realities, not knowing what to do.  Currently taking Geodon 160 mg with a positive response but has experienced some agitation and restlessness, and she believes that this may be related to restlessness and hyperactivity from her attention deficit disorder which was diagnosed early on in her life.  She does have a history of abusing Adderall in the past, and her mother has accepted control of the Adderall.  She has seemed calmer when she has taken the Adderall as prescribed.  She is currently using Valium but not abusing it.  PAST PSYCHIATRIC HISTORY:  Followed as an outpatient by Dr. Geoffery Lyons in his practice.  She has a history of a previous admission to [facility] and was recently discharged from our unit a couple of months ago.  CURRENT HOME MEDICATIONS:  Include: 1. Adderall 20 mg p.o. q.a.m. and 5 mg p.o. x1 any time before 3:00     p.m. 2. Gabapentin 300 mg at 6:00 a.m., 10:00 p.m and 2:00 p.m. 3. Geodon 160 mg p.o.  q.h.s. 4. Zyrtec 1 tablet daily. 5. Fish oil 1 daily. 6. Valium 5 mg daily and may take up to 10 mg twice daily as needed     for anxiety. 7. Lexapro 10 mg daily. 8. Flonase nasal spray for perennial allergies. 9. She takes an oral contraceptive. 10.B12 supplement. 11.Saline nasal spray as needed for moisture.  MEDICAL EVALUATION:  A full physical exam was done in the emergency room and is noted in the record.  This is a fully alert female, normally developed, petite build, anxious in appearance.  Diagnostic studies were unremarkable.  SOCIAL HISTORY:  Single Caucasian female.  She lives at home with her mother and has a young child at home.  No legal problems.  MENTAL STATUS EXAM:  Alert, cooperative.  Wants to be sure she gets her Adderall at two o'clock p.m., as she will not be able to function without it but will not be able to sleep if she takes it too late. Seems quite insightful.  Describes living in two realities.  No active suicidal thoughts.  Axis I:  Mood disorder not otherwise specified. Axis II:  No diagnosis. Axis III:  No diagnosis. Axis IV:  Deferred. Axis V:  Current 45, past year not known.  PLAN:  We will decrease her Geodon to 120 mg daily, and we will consider adding Depakote.  We will start decreasing her gabapentin.     Margaret A. Lorin Picket, N.P.   ______________________________ Geoffery Lyons, M.D.    MAS/MEDQ  D:  10/12/2010  T:  10/12/2010  Job:  [id number]  Electronically Signed by Kari Baars N.P. on 10/13/2010 08:35:52 AM Electronically Signed by Nelly Rout MD on 11/22/2010 06:07:07 PM

## 2011-06-17 ENCOUNTER — Emergency Department (HOSPITAL_BASED_OUTPATIENT_CLINIC_OR_DEPARTMENT_OTHER): Payer: Medicaid Other

## 2011-06-17 ENCOUNTER — Encounter (HOSPITAL_BASED_OUTPATIENT_CLINIC_OR_DEPARTMENT_OTHER): Payer: Self-pay | Admitting: *Deleted

## 2011-06-17 ENCOUNTER — Emergency Department (HOSPITAL_BASED_OUTPATIENT_CLINIC_OR_DEPARTMENT_OTHER)
Admission: EM | Admit: 2011-06-17 | Discharge: 2011-06-17 | Disposition: A | Discharge status: Home / Self Care | Payer: Medicaid Other | Attending: Emergency Medicine | Admitting: Emergency Medicine

## 2011-06-17 DIAGNOSIS — N200 Calculus of kidney: Secondary | ICD-10-CM | POA: Insufficient documentation

## 2011-06-17 DIAGNOSIS — Z79899 Other long term (current) drug therapy: Secondary | ICD-10-CM | POA: Insufficient documentation

## 2011-06-17 DIAGNOSIS — N201 Calculus of ureter: Secondary | ICD-10-CM | POA: Insufficient documentation

## 2011-06-17 DIAGNOSIS — N1330 Unspecified hydronephrosis: Secondary | ICD-10-CM | POA: Insufficient documentation

## 2011-06-17 DIAGNOSIS — R110 Nausea: Secondary | ICD-10-CM | POA: Insufficient documentation

## 2011-06-17 LAB — URINALYSIS, ROUTINE W REFLEX MICROSCOPIC
Glucose, UA: NEGATIVE mg/dL
Ketones, ur: 15 mg/dL — AB
Specific Gravity, Urine: 1.024 (ref 1.005–1.030)
pH: 5.5 (ref 5.0–8.0)

## 2011-06-17 LAB — URINE MICROSCOPIC-ADD ON

## 2011-06-17 LAB — BASIC METABOLIC PANEL
BUN: 9 mg/dL (ref 6–23)
Calcium: 9.4 mg/dL (ref 8.4–10.5)
Chloride: 105 mEq/L (ref 96–112)
Creatinine, Ser: 0.9 mg/dL (ref 0.50–1.10)
GFR calc Af Amer: >90 mL/min (ref >90)
GFR calc non Af Amer: 89 mL/min — ABNORMAL LOW (ref >90)

## 2011-06-17 MED ORDER — IBUPROFEN 800 MG PO TABS: 800.0000 mg | ORAL_TABLET | Freq: Three times a day (TID) | ORAL | Status: AC

## 2011-06-17 MED ORDER — OXYCODONE HCL 5 MG PO TABS: 5.0000 mg | ORAL_TABLET | ORAL | Status: AC | PRN

## 2011-06-17 MED ORDER — KETOROLAC TROMETHAMINE 30 MG/ML IJ SOLN
30.0000 mg | Freq: Once | INTRAMUSCULAR | Status: AC
  Administered 2011-06-17: 30 mg via INTRAVENOUS
  Filled 2011-06-17: qty 1
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
## 2011-06-17 NOTE — Discharge Instructions (Signed)

## 2011-06-17 NOTE — ED Notes (Signed)
Admits to vaginal discharge x 2 weeks but states this is normal for her.

## 2011-06-17 NOTE — ED Notes (Signed)
Left flank pain since this am. Worse as the morning progressed. Urinary frequency last night. This am urine output has been small. States she is having spasms when she urinates.

## 2011-06-26 ENCOUNTER — Emergency Department (HOSPITAL_BASED_OUTPATIENT_CLINIC_OR_DEPARTMENT_OTHER): Payer: Medicaid Other

## 2011-06-26 ENCOUNTER — Emergency Department (HOSPITAL_BASED_OUTPATIENT_CLINIC_OR_DEPARTMENT_OTHER)
Admission: EM | Admit: 2011-06-26 | Discharge: 2011-06-26 | Disposition: A | Discharge status: Home / Self Care | Payer: Medicaid Other | Attending: Emergency Medicine | Admitting: Emergency Medicine

## 2011-06-26 ENCOUNTER — Encounter (HOSPITAL_BASED_OUTPATIENT_CLINIC_OR_DEPARTMENT_OTHER): Payer: Self-pay

## 2011-06-26 DIAGNOSIS — N201 Calculus of ureter: Secondary | ICD-10-CM

## 2011-06-26 DIAGNOSIS — F17200 Nicotine dependence, unspecified, uncomplicated: Secondary | ICD-10-CM | POA: Insufficient documentation

## 2011-06-26 DIAGNOSIS — N289 Disorder of kidney and ureter, unspecified: Secondary | ICD-10-CM | POA: Insufficient documentation

## 2011-06-26 DIAGNOSIS — Z79899 Other long term (current) drug therapy: Secondary | ICD-10-CM | POA: Insufficient documentation

## 2011-06-26 HISTORY — DX: Calculus of kidney: N20.0

## 2011-06-26 HISTORY — DX: Disorder of kidney and ureter, unspecified: N28.9

## 2011-06-26 LAB — URINALYSIS, ROUTINE W REFLEX MICROSCOPIC
Bilirubin Urine: NEGATIVE
Glucose, UA: NEGATIVE mg/dL
Ketones, ur: NEGATIVE mg/dL
Leukocytes, UA: NEGATIVE
Nitrite: NEGATIVE
Protein, ur: NEGATIVE mg/dL
Specific Gravity, Urine: 1.027 (ref 1.005–1.030)
Urobilinogen, UA: 1 mg/dL (ref 0.0–1.0)
pH: 6 (ref 5.0–8.0)

## 2011-06-26 LAB — URINE MICROSCOPIC-ADD ON

## 2011-06-26 LAB — PREGNANCY, URINE: Preg Test, Ur: NEGATIVE

## 2011-06-26 MED ORDER — TAMSULOSIN HCL 0.4 MG PO CAPS: 0.4000 mg | ORAL_CAPSULE | Freq: Every day | ORAL | Status: DC

## 2011-06-26 MED ORDER — KETOROLAC TROMETHAMINE 60 MG/2ML IM SOLN
60.0000 mg | Freq: Once | INTRAMUSCULAR | Status: AC
  Administered 2011-06-26: 60 mg via INTRAMUSCULAR
  Filled 2011-06-26: qty 2

## 2011-06-26 MED ORDER — HYDROMORPHONE HCL PF 1 MG/ML IJ SOLN
1.0000 mg | Freq: Once | INTRAMUSCULAR | Status: AC
  Administered 2011-06-26: 1 mg via INTRAMUSCULAR
  Filled 2011-06-26: qty 1

## 2011-06-26 MED ORDER — OXYCODONE HCL 5 MG PO TABS: 5.0000 mg | ORAL_TABLET | ORAL | Status: AC | PRN

## 2011-06-26 NOTE — ED Notes (Signed)
Pt reports seen for a recent kidney stone and continues to c/o LLQ pain and urinary retention.

## 2011-06-26 NOTE — Discharge Instructions (Signed)
Kidney Stones Kidney stones (ureteral lithiasis) are deposits that form inside your kidneys. The intense pain is caused by the stone moving through the urinary tract. When the stone moves, the ureter goes into spasm around the stone. The stone is usually passed in the urine.  CAUSES   A disorder that makes certain neck glands produce too much parathyroid hormone (primary hyperparathyroidism).   A buildup of uric acid crystals.   Narrowing (stricture) of the ureter.   A kidney obstruction present at birth (congenital obstruction).   Previous surgery on the kidney or ureters.   Numerous kidney infections.  SYMPTOMS   Feeling sick to your stomach (nauseous).   Throwing up (vomiting).   Blood in the urine (hematuria).   Pain that usually spreads (radiates) to the groin.   Frequency or urgency of urination.  DIAGNOSIS   Taking a history and physical exam.   Blood or urine tests.   Computerized X-ray scan (CT scan).   Occasionally, an examination of the inside of the urinary bladder (cystoscopy) is performed.  TREATMENT   Observation.   Increasing your fluid intake.   Surgery may be needed if you have severe pain or persistent obstruction.  The size, location, and chemical composition are all important variables that will determine the proper choice of action for you. Talk to your caregiver to better understand your situation so that you will minimize the risk of injury to yourself and your kidney.  HOME CARE INSTRUCTIONS   Drink enough water and fluids to keep your urine clear or pale yellow.   Strain all urine through the provided strainer. Keep all particulate matter and stones for your caregiver to see. The stone causing the pain may be as small as a grain of salt. It is very important to use the strainer each and every time you pass your urine. The collection of your stone will allow your caregiver to analyze it and verify that a stone has actually passed.   Only take  over-the-counter or prescription medicines for pain, discomfort, or fever as directed by your caregiver.   Make a follow-up appointment with your caregiver as directed.   Get follow-up X-rays if required. The absence of pain does not always mean that the stone has passed. It may have only stopped moving. If the urine remains completely obstructed, it can cause loss of kidney function or even complete destruction of the kidney. It is your responsibility to make sure X-rays and follow-ups are completed. Ultrasounds of the kidney can show blockages and the status of the kidney. Ultrasounds are not associated with any radiation and can be performed easily in a matter of minutes.  SEEK IMMEDIATE MEDICAL CARE IF:   Pain cannot be controlled with the prescribed medicine.   You have a fever.   The severity or intensity of pain increases over 18 hours and is not relieved by pain medicine.   You develop a new onset of abdominal pain.   You feel faint or pass out.  MAKE SURE YOU:   Understand these instructions.   Will watch your condition.   Will get help right away if you are not doing well or get worse.  Document Released: 01/02/2005 Document Revised: 12/22/2010 Document Reviewed: 04/30/2009 Exit[employer] Patient Information 2012 [employer].    Narcotic and benzodiazepine use may cause drowsiness, slowed breathing or dependence.  Please use with caution and do not drive, operate machinery or watch young children alone while taking them.  Taking combinations of these   medications or drinking alcohol will potentiate these effects.    
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
## 2011-07-14 ENCOUNTER — Other Ambulatory Visit: Payer: Self-pay | Admitting: Urology

## 2011-07-27 ENCOUNTER — Encounter (HOSPITAL_COMMUNITY): Payer: Self-pay | Admitting: Pharmacy Technician

## 2011-07-27 ENCOUNTER — Encounter (HOSPITAL_COMMUNITY): Payer: Self-pay | Admitting: *Deleted

## 2011-07-27 NOTE — Progress Notes (Signed)
Spoke to patient via phone,history obtained,updated.  Bring blue folder,insurance cards,picture ID,designated driver and living will,POA, if desires (to be placed on chart). Reinforced no aspirin(instructions to hold aspirin per your doctor), ibuprofen products 72 hours prior to procedure. No vitamins or herbal medicines 7 days prior to procedure.   Follow laxative instructions provided by urologist (office) and in blue folder. Wear easy on/off clothing and no jewelry except wedding rings and ear rings. Leave all other valuables at home. Verbalizes understanding of instructions Spoke to patient via phone,history obtained,updated.  Bring blue folder,insurance cards,picture ID,designated driver and living will,POA, if desires (to be placed on chart). Reinforced no aspirin(instructions to hold aspirin per your doctor), ibuprofen products 72 hours prior to procedure. No vitamins or herbal medicines 7 days prior to procedure.   Follow laxative instructions provided by urologist (office) and in blue folder. Wear easy on/off clothing and no jewelry except wedding rings and ear rings. Leave all other valuables at home. Verbalizes understanding of instructions

## 2011-07-30 NOTE — H&P (Signed)
  History of Present Illness     Nephrolithiasis: She was found on CT scan done on 06/17/11 to have a minute bilateral renal calculi as well as a 5 mm stone located in the proximal left ureter. Followup [facility] on 06/26/11 revealed the stone had progressed to the distal ureter.   Interval HX: She was placed on medical expulsive therapy. She continues to have intermittent flank/right lower quadrant pain. She has not seen her stone pass.   Past Medical History Problems  1. History of  Anxiety (Symptom) 300.00 2. History of  Heartburn 787.1  Surgical History Problems  1. History of  Breast Surgery Reduction Procedure  Current Meds 1. Adderall 20 MG Oral Tablet; Therapy: (Recorded:26Jun2013) to 2. Neurontin 600 MG Oral Tablet; Therapy: (Recorded:26Jun2013) to 3. OxyCODONE HCl 5 MG Oral Capsule; TAKE 1 CAPSULE EVERY 4 TO 6 HOURS AS NEEDED;  Therapy: 26Jun2013 to (Last Rx:26Jun2013) 4. SEROquel 50 MG Oral Tablet; Therapy: (Recorded:26Jun2013) to  Allergies Medication  1. Tylenol TABS  Family History Problems  1. Family history of  Family Health Status - Father's Age 90 2. Family history of  Family Health Status - Mother's Age 36 3. Family history of  Family Health Status Number Of Children 1 daughter 4. Paternal history of  Nephrolithiasis 5. Fraternal history of  Nephrolithiasis  Social History Problems  1. Being A Social Drinker 2. Caffeine Use 3 a day 3. Marital History - Single 4. Tobacco Use 305.1 1/2 ppd for 6 years  Review of Systems Genitourinary, constitutional, skin, eye, otolaryngeal, hematologic/lymphatic, cardiovascular, pulmonary, endocrine, musculoskeletal, gastrointestinal, neurological and psychiatric system(s) were reviewed and pertinent findings if present are noted.  Genitourinary: urinary frequency, urinary urgency, dysuria, nocturia and hematuria.  Gastrointestinal: nausea, abdominal pain and heartburn.  Constitutional: feeling tired (fatigue).    Musculoskeletal: back pain.  Psychiatric: anxiety.    Vitals Vital Signs BMI Calculated: 23.55 BSA Calculated: 1.58 Height: 5 ft 2 in Weight: 128 lb  Blood Pressure: 113 / 81 Temperature: 98.6 F Heart Rate: 103  Physical Exam Constitutional: Well nourished and well developed . No acute distress. The patient appears well hydrated.  ENT:. The ears and nose are normal in appearance.  Neck: The appearance of the neck is normal.  Pulmonary: No respiratory distress.  Cardiovascular: Heart rate and rhythm are normal.  Abdomen: The abdomen is flat. The abdomen is soft and nontender. No suprapubic tenderness. No CVA tenderness.  Skin: Normal skin turgor and normal skin color and pigmentation.  Neuro/Psych:. Mood and affect are appropriate.   The following images/tracing/specimen were independently visualized:  CT scan as above.  The following clinical lab reports were reviewed:  I note her creatinine was normal at 0.9 with a normal serum calcium of 9.4.    Assessment Assessed  1. Distal Ureteral Stone On The Left 592.1   I went over her x-ray results with her which has revealed the stone has progressed maybe a half a centimeter. We discussed the treatment options which would be continued medical expulsive therapy but because she's been having intermittent pain she did not want to proceed in that fashion. I therefore went over ureteroscopy and lithotripsy. I discussed each of these procedures in detail including the pluses and minuses and probabilities of success. She has elected to proceed with lithotripsy.   Plan  She is scheduled for lithotripsy of her left distal ureteral stone.

## 2011-07-31 ENCOUNTER — Ambulatory Visit (HOSPITAL_COMMUNITY)
Admission: RE | Admit: 2011-07-31 | Discharge: 2011-07-31 | Disposition: A | Discharge status: Home / Self Care | Payer: Medicaid Other | Source: Ambulatory Visit | Attending: Urology | Admitting: Urology

## 2011-07-31 ENCOUNTER — Encounter (HOSPITAL_COMMUNITY): Payer: Self-pay | Admitting: *Deleted

## 2011-07-31 ENCOUNTER — Encounter (HOSPITAL_COMMUNITY)
Admission: RE | Disposition: A | Discharge status: Home / Self Care | Payer: Self-pay | Source: Ambulatory Visit | Attending: Urology

## 2011-07-31 DIAGNOSIS — N200 Calculus of kidney: Secondary | ICD-10-CM | POA: Insufficient documentation

## 2011-07-31 DIAGNOSIS — N201 Calculus of ureter: Secondary | ICD-10-CM | POA: Diagnosis present

## 2011-07-31 DIAGNOSIS — F17200 Nicotine dependence, unspecified, uncomplicated: Secondary | ICD-10-CM | POA: Insufficient documentation

## 2011-07-31 DIAGNOSIS — Z79899 Other long term (current) drug therapy: Secondary | ICD-10-CM | POA: Insufficient documentation

## 2011-07-31 DIAGNOSIS — F411 Generalized anxiety disorder: Secondary | ICD-10-CM | POA: Insufficient documentation

## 2011-07-31 DIAGNOSIS — R12 Heartburn: Secondary | ICD-10-CM | POA: Insufficient documentation

## 2011-07-31 LAB — PREGNANCY, URINE: Preg Test, Ur: NEGATIVE

## 2011-07-31 SURGERY — LITHOTRIPSY, ESWL
Anesthesia: LOCAL | Laterality: Left

## 2011-07-31 MED ORDER — DIAZEPAM 5 MG PO TABS
10.0000 mg | ORAL_TABLET | ORAL | Status: AC
  Administered 2011-07-31: 10 mg via ORAL

## 2011-07-31 MED ORDER — TAMSULOSIN HCL 0.4 MG PO CAPS
0.4000 mg | ORAL_CAPSULE | Freq: Once | ORAL | Status: AC
  Administered 2011-07-31: 0.4 mg via ORAL
  Filled 2011-07-31: qty 1

## 2011-07-31 MED ORDER — DIAZEPAM 5 MG PO TABS
ORAL_TABLET | ORAL | Status: AC
  Administered 2011-07-31: 10 mg via ORAL
  Filled 2011-07-31: qty 2

## 2011-07-31 MED ORDER — TAMSULOSIN HCL 0.4 MG PO CAPS: 0.4000 mg | ORAL_CAPSULE | ORAL | Status: DC

## 2011-07-31 MED ORDER — OXYCODONE HCL 5 MG PO TABS: 10.0000 mg | ORAL_TABLET | ORAL | Status: DC | PRN

## 2011-07-31 MED ORDER — CIPROFLOXACIN HCL 500 MG PO TABS
500.0000 mg | ORAL_TABLET | ORAL | Status: AC
  Administered 2011-07-31: 500 mg via ORAL

## 2011-07-31 MED ORDER — DIPHENHYDRAMINE HCL 25 MG PO CAPS
ORAL_CAPSULE | ORAL | Status: AC
  Administered 2011-07-31: 25 mg via ORAL
  Filled 2011-07-31: qty 1

## 2011-07-31 MED ORDER — SODIUM CHLORIDE 0.9 % IV SOLN
INTRAVENOUS | Status: DC
  Administered 2011-07-31: 1000 mL via INTRAVENOUS

## 2011-07-31 MED ORDER — CIPROFLOXACIN HCL 500 MG PO TABS
ORAL_TABLET | ORAL | Status: AC
  Administered 2011-07-31: 500 mg via ORAL
  Filled 2011-07-31: qty 1

## 2011-07-31 MED ORDER — DIPHENHYDRAMINE HCL 25 MG PO CAPS
25.0000 mg | ORAL_CAPSULE | ORAL | Status: AC
  Administered 2011-07-31: 25 mg via ORAL

## 2011-07-31 MED ORDER — TRAMADOL HCL 50 MG PO TABS
50.0000 mg | ORAL_TABLET | Freq: Four times a day (QID) | ORAL | Status: AC | PRN
Start: 2011-07-31 — End: 2011-08-10

## 2011-07-31 NOTE — Interval H&P Note (Signed)
History and Physical Interval Note:  07/31/2011 10:55 AM  Amanda Chang  has presented today for surgery, with the diagnosis of Left Ureteral Stone  The various methods of treatment have been discussed with the patient and family. After consideration of risks, benefits and other options for treatment, the patient has consented to  Procedure(s) (LRB): EXTRACORPOREAL SHOCK WAVE LITHOTRIPSY (ESWL) (Left) as a surgical intervention .  The patient's history has been reviewed, patient examined, no change in status, stable for surgery.  I have reviewed the patients' chart and labs.  Questions were answered to the patient's satisfaction.     [place]

## 2011-07-31 NOTE — Op Note (Signed)
See [facility] OP note scanned into chart. 

## 2011-08-01 ENCOUNTER — Encounter (HOSPITAL_COMMUNITY): Payer: Self-pay

## 2013-08-31 ENCOUNTER — Emergency Department (HOSPITAL_BASED_OUTPATIENT_CLINIC_OR_DEPARTMENT_OTHER): Payer: Medicare Other

## 2013-08-31 ENCOUNTER — Emergency Department (HOSPITAL_BASED_OUTPATIENT_CLINIC_OR_DEPARTMENT_OTHER)
Admission: EM | Admit: 2013-08-31 | Discharge: 2013-08-31 | Disposition: A | Discharge status: Home / Self Care | Payer: Medicare Other | Attending: Emergency Medicine | Admitting: Emergency Medicine

## 2013-08-31 ENCOUNTER — Encounter (HOSPITAL_BASED_OUTPATIENT_CLINIC_OR_DEPARTMENT_OTHER): Payer: Self-pay | Admitting: Emergency Medicine

## 2013-08-31 DIAGNOSIS — S59919A Unspecified injury of unspecified forearm, initial encounter: Principal | ICD-10-CM

## 2013-08-31 DIAGNOSIS — Z87442 Personal history of urinary calculi: Secondary | ICD-10-CM | POA: Insufficient documentation

## 2013-08-31 DIAGNOSIS — S59909A Unspecified injury of unspecified elbow, initial encounter: Secondary | ICD-10-CM | POA: Diagnosis not present

## 2013-08-31 DIAGNOSIS — W2203XA Walked into furniture, initial encounter: Secondary | ICD-10-CM | POA: Insufficient documentation

## 2013-08-31 DIAGNOSIS — Y929 Unspecified place or not applicable: Secondary | ICD-10-CM | POA: Diagnosis not present

## 2013-08-31 DIAGNOSIS — Z87828 Personal history of other (healed) physical injury and trauma: Secondary | ICD-10-CM | POA: Insufficient documentation

## 2013-08-31 DIAGNOSIS — Y9389 Activity, other specified: Secondary | ICD-10-CM | POA: Diagnosis not present

## 2013-08-31 DIAGNOSIS — Z87448 Personal history of other diseases of urinary system: Secondary | ICD-10-CM | POA: Diagnosis not present

## 2013-08-31 DIAGNOSIS — F17200 Nicotine dependence, unspecified, uncomplicated: Secondary | ICD-10-CM | POA: Insufficient documentation

## 2013-08-31 DIAGNOSIS — S6990XA Unspecified injury of unspecified wrist, hand and finger(s), initial encounter: Principal | ICD-10-CM

## 2013-08-31 DIAGNOSIS — M25539 Pain in unspecified wrist: Secondary | ICD-10-CM | POA: Diagnosis not present

## 2013-08-31 DIAGNOSIS — S6992XA Unspecified injury of left wrist, hand and finger(s), initial encounter: Secondary | ICD-10-CM

## 2013-08-31 DIAGNOSIS — Z79899 Other long term (current) drug therapy: Secondary | ICD-10-CM | POA: Insufficient documentation

## 2013-08-31 MED ORDER — OXYCODONE HCL 5 MG PO TABS: 5.0000 mg | ORAL_TABLET | ORAL | Status: DC | PRN

## 2013-08-31 MED ORDER — OXYCODONE HCL 5 MG PO TABS
5.0000 mg | ORAL_TABLET | Freq: Once | ORAL | Status: AC
  Administered 2013-08-31: 5 mg via ORAL
  Filled 2013-08-31: qty 1

## 2013-08-31 MED ORDER — IBUPROFEN 600 MG PO TABS: 600.0000 mg | ORAL_TABLET | Freq: Four times a day (QID) | ORAL | Status: DC | PRN
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
## 2013-08-31 NOTE — Discharge Instructions (Signed)
Return to the ED with any concerns including increased pain, numbness/discoloration/swelling of fingers, or any other alarming symptoms °

## 2013-08-31 NOTE — ED Notes (Signed)
Patient states she injured her left wrist this morning. States her hand got stuck behind furniture

## 2013-10-23 ENCOUNTER — Encounter (HOSPITAL_COMMUNITY): Payer: Self-pay | Admitting: Emergency Medicine

## 2013-10-23 ENCOUNTER — Emergency Department (HOSPITAL_COMMUNITY)
Admission: EM | Admit: 2013-10-23 | Discharge: 2013-10-24 | Disposition: A | Discharge status: Home / Self Care | Payer: Medicare Other | Attending: Emergency Medicine | Admitting: Emergency Medicine

## 2013-10-23 ENCOUNTER — Ambulatory Visit (HOSPITAL_COMMUNITY)
Admission: RE | Admit: 2013-10-23 | Discharge: 2013-10-23 | Disposition: A | Discharge status: Home / Self Care | Payer: Medicare Other | Attending: Psychiatry | Admitting: Psychiatry

## 2013-10-23 DIAGNOSIS — Z87442 Personal history of urinary calculi: Secondary | ICD-10-CM | POA: Diagnosis not present

## 2013-10-23 DIAGNOSIS — S60812A Abrasion of left wrist, initial encounter: Secondary | ICD-10-CM | POA: Insufficient documentation

## 2013-10-23 DIAGNOSIS — Z79899 Other long term (current) drug therapy: Secondary | ICD-10-CM | POA: Insufficient documentation

## 2013-10-23 DIAGNOSIS — X789XXA Intentional self-harm by unspecified sharp object, initial encounter: Secondary | ICD-10-CM | POA: Diagnosis not present

## 2013-10-23 DIAGNOSIS — Z720 Tobacco use: Secondary | ICD-10-CM | POA: Diagnosis not present

## 2013-10-23 DIAGNOSIS — Z87828 Personal history of other (healed) physical injury and trauma: Secondary | ICD-10-CM | POA: Insufficient documentation

## 2013-10-23 DIAGNOSIS — F329 Major depressive disorder, single episode, unspecified: Secondary | ICD-10-CM | POA: Diagnosis present

## 2013-10-23 DIAGNOSIS — F32A Depression, unspecified: Secondary | ICD-10-CM

## 2013-10-23 DIAGNOSIS — F319 Bipolar disorder, unspecified: Secondary | ICD-10-CM | POA: Diagnosis not present

## 2013-10-23 DIAGNOSIS — R45851 Suicidal ideations: Secondary | ICD-10-CM | POA: Diagnosis present

## 2013-10-23 DIAGNOSIS — Z7289 Other problems related to lifestyle: Secondary | ICD-10-CM

## 2013-10-23 DIAGNOSIS — S60811A Abrasion of right wrist, initial encounter: Secondary | ICD-10-CM | POA: Diagnosis present

## 2013-10-23 LAB — CBC WITH DIFFERENTIAL/PLATELET
Basophils Absolute: 0 10*3/uL (ref 0.0–0.1)
Basophils Relative: 0 % (ref 0–1)
Eosinophils Absolute: 0 10*3/uL (ref 0.0–0.7)
Eosinophils Relative: 0 % (ref 0–5)
HCT: 42.4 % (ref 36.0–46.0)
Hemoglobin: 14.8 g/dL (ref 12.0–15.0)
Lymphocytes Relative: 22 % (ref 12–46)
Lymphs Abs: 1.7 10*3/uL (ref 0.7–4.0)
MCH: 32 pg (ref 26.0–34.0)
MCHC: 34.9 g/dL (ref 30.0–36.0)
MCV: 91.8 fL (ref 78.0–100.0)
Monocytes Absolute: 0.4 10*3/uL (ref 0.1–1.0)
Monocytes Relative: 5 % (ref 3–12)
Neutro Abs: 5.6 10*3/uL (ref 1.7–7.7)
Neutrophils Relative %: 73 % (ref 43–77)
Platelets: 204 10*3/uL (ref 150–400)
RBC: 4.62 MIL/uL (ref 3.87–5.11)
RDW: 12.4 % (ref 11.5–15.5)
WBC: 7.7 10*3/uL (ref 4.0–10.5)

## 2013-10-23 LAB — COMPREHENSIVE METABOLIC PANEL
ALT: 21 U/L (ref 0–35)
AST: 19 U/L (ref 0–37)
Albumin: 4 g/dL (ref 3.5–5.2)
Alkaline Phosphatase: 61 U/L (ref 39–117)
Anion gap: 14 (ref 5–15)
BUN: 7 mg/dL (ref 6–23)
CO2: 22 mEq/L (ref 19–32)
Calcium: 9.2 mg/dL (ref 8.4–10.5)
Chloride: 105 mEq/L (ref 96–112)
Creatinine, Ser: 0.81 mg/dL (ref 0.50–1.10)
GFR calc Af Amer: >90 mL/min (ref >90)
GFR calc non Af Amer: >90 mL/min (ref >90)
Glucose, Bld: 101 mg/dL — ABNORMAL HIGH (ref 70–99)
Potassium: 3 mEq/L — ABNORMAL LOW (ref 3.7–5.3)
Sodium: 141 mEq/L (ref 137–147)
Total Bilirubin: 1.8 mg/dL — ABNORMAL HIGH (ref 0.3–1.2)
Total Protein: 7.5 g/dL (ref 6.0–8.3)

## 2013-10-23 LAB — RAPID URINE DRUG SCREEN, HOSP PERFORMED
Amphetamines: NOT DETECTED
Barbiturates: NOT DETECTED
Benzodiazepines: NOT DETECTED
Cocaine: NOT DETECTED
Opiates: NOT DETECTED
Tetrahydrocannabinol: NOT DETECTED

## 2013-10-23 LAB — ETHANOL: Alcohol, Ethyl (B): <11 mg/dL (ref 0–11)

## 2013-10-23 MED ORDER — POTASSIUM CHLORIDE CRYS ER 20 MEQ PO TBCR
40.0000 meq | EXTENDED_RELEASE_TABLET | Freq: Once | ORAL | Status: AC
  Administered 2013-10-23: 40 meq via ORAL
  Filled 2013-10-23: qty 2

## 2013-10-23 MED ORDER — IBUPROFEN 200 MG PO TABS
600.0000 mg | ORAL_TABLET | Freq: Three times a day (TID) | ORAL | Status: DC | PRN
Start: 2013-10-23 — End: 2013-10-24
  Administered 2013-10-23: 600 mg via ORAL
  Filled 2013-10-23 (×2): qty 3

## 2013-10-23 MED ORDER — TIZANIDINE HCL 4 MG PO TABS
8.0000 mg | ORAL_TABLET | Freq: Every day | ORAL | Status: DC
  Administered 2013-10-23: 8 mg via ORAL
  Filled 2013-10-23 (×2): qty 2

## 2013-10-23 MED ORDER — OXYCODONE HCL 5 MG PO TABS
10.0000 mg | ORAL_TABLET | Freq: Once | ORAL | Status: AC
  Administered 2013-10-24: 10 mg via ORAL
  Filled 2013-10-23: qty 2

## 2013-10-23 MED ORDER — ZOLPIDEM TARTRATE 5 MG PO TABS: 5.0000 mg | ORAL_TABLET | Freq: Every evening | ORAL | Status: DC | PRN

## 2013-10-23 MED ORDER — ALUM & MAG HYDROXIDE-SIMETH 200-200-20 MG/5ML PO SUSP: 30.0000 mL | ORAL | Status: DC | PRN

## 2013-10-23 MED ORDER — OXYCODONE HCL 5 MG PO TABS
10.0000 mg | ORAL_TABLET | Freq: Once | ORAL | Status: AC
  Administered 2013-10-23: 10 mg via ORAL
  Filled 2013-10-23 (×2): qty 2

## 2013-10-23 MED ORDER — NICOTINE 21 MG/24HR TD PT24: 21.0000 mg | MEDICATED_PATCH | Freq: Every day | TRANSDERMAL | Status: DC

## 2013-10-23 MED ORDER — ONDANSETRON HCL 4 MG PO TABS: 4.0000 mg | ORAL_TABLET | Freq: Three times a day (TID) | ORAL | Status: DC | PRN

## 2013-10-23 MED ORDER — LORAZEPAM 1 MG PO TABS: 1.0000 mg | ORAL_TABLET | Freq: Three times a day (TID) | ORAL | Status: DC | PRN

## 2013-10-23 MED ORDER — QUETIAPINE FUMARATE 50 MG PO TABS
75.0000 mg | ORAL_TABLET | Freq: Every day | ORAL | Status: DC
  Administered 2013-10-23: 75 mg via ORAL
  Filled 2013-10-23 (×2): qty 1
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
## 2013-10-23 NOTE — ED Notes (Signed)
Pt ambulatory w/o difficulty  to room 34 from triage 
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
## 2013-10-23 NOTE — ED Notes (Signed)
Up to the desk on the phone 

## 2013-10-23 NOTE — ED Notes (Signed)
Patient presents anxious; asking about her pain medications, denies any current thoughts of SI/HI/AVH. NAD

## 2013-10-23 NOTE — ED Notes (Signed)
[facility] was sent to ED for medical clearance. Pt c/o suicidal ideation without plan, pt cut wrists perpendicular to bone/vessels for emotional pain release, denies suicidal attempt. Pt sees psychiatrist for anxiety and depression, missed last appointment and psychiatrist was unable to refill prescriptions due to not having seen patient. Pt denies hallucinations, endorses insomnia and anxiety. Denies other complaints.

## 2013-10-23 NOTE — ED Provider Notes (Signed)
Medical screening examination/treatment/procedure(s) were performed by non-physician practitioner and as supervising physician I was immediately available for consultation/collaboration.    Linwood DibblesJon Nuha Degner, MD 10/23/13 [phone]

## 2013-10-24 ENCOUNTER — Encounter (HOSPITAL_COMMUNITY): Payer: Self-pay | Admitting: Psychiatry

## 2013-10-24 DIAGNOSIS — F329 Major depressive disorder, single episode, unspecified: Secondary | ICD-10-CM

## 2013-10-24 DIAGNOSIS — S60811A Abrasion of right wrist, initial encounter: Secondary | ICD-10-CM | POA: Diagnosis not present

## 2013-10-24 MED ORDER — AMPHETAMINE-DEXTROAMPHETAMINE 15 MG PO TABS: 15.0000 mg | ORAL_TABLET | Freq: Two times a day (BID) | ORAL | Status: DC

## 2013-10-24 MED ORDER — QUETIAPINE FUMARATE 25 MG PO TABS: 75.0000 mg | ORAL_TABLET | Freq: Every day | ORAL | Status: DC

## 2013-10-24 MED ORDER — GABAPENTIN 400 MG PO CAPS: 400.0000 mg | ORAL_CAPSULE | ORAL | Status: DC

## 2013-10-24 MED ORDER — MEDROXYPROGESTERONE ACETATE 150 MG/ML IM SUSP: 150.0000 mg | INTRAMUSCULAR | Status: DC

## 2013-10-24 NOTE — BH Assessment (Addendum)
D/C per Dr. Jannifer FranklinAkintayo and Nanine MeansJamison Lord, NP. [facility] Writer attempted to meet with patient to provide referrals, however; discharged home prior to writer meeting with patient.

## 2013-10-24 NOTE — ED Notes (Signed)
D: Patient alert and oriented. Pts mood is appropriate for situation. Pts affect is blunted. Pt reports back pain, 6/10. Pt denies SI/HI and AVH at this time. Pt states she slept only a little last night. Pts main concern upon discharge is getting back on her medications. Patient has been observed walking the halls this morning; she states the bed is too firm and walking helps her back to feel better.   A: Medication provided for pain, see MAR. 15 minutes checks completed per protocol for safety. RN accompanied provider during rounds. Pt was informed she will be discharged today.   R: Patient receptive, cooperative, and pleasant. Pt reported decrease in pain, 4/10.    Alena BillsGuthrie, Kewanda Poland A, RN
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
## 2014-02-17 ENCOUNTER — Ambulatory Visit (HOSPITAL_COMMUNITY): Payer: Self-pay | Admitting: Physician Assistant

## 2014-04-02 ENCOUNTER — Ambulatory Visit (HOSPITAL_COMMUNITY): Payer: Self-pay | Admitting: Physician Assistant

## 2014-04-08 ENCOUNTER — Ambulatory Visit (HOSPITAL_COMMUNITY): Payer: Self-pay | Admitting: Physician Assistant

## 2014-04-09 ENCOUNTER — Ambulatory Visit (HOSPITAL_COMMUNITY): Payer: Self-pay | Admitting: Psychiatry

## 2014-05-04 ENCOUNTER — Emergency Department (HOSPITAL_BASED_OUTPATIENT_CLINIC_OR_DEPARTMENT_OTHER)
Admission: EM | Admit: 2014-05-04 | Discharge: 2014-05-04 | Disposition: A | Discharge status: Home / Self Care | Payer: Medicare Other | Attending: Emergency Medicine | Admitting: Emergency Medicine

## 2014-05-04 ENCOUNTER — Encounter (HOSPITAL_BASED_OUTPATIENT_CLINIC_OR_DEPARTMENT_OTHER): Payer: Self-pay

## 2014-05-04 DIAGNOSIS — M545 Low back pain: Secondary | ICD-10-CM | POA: Diagnosis present

## 2014-05-04 DIAGNOSIS — G8929 Other chronic pain: Secondary | ICD-10-CM | POA: Insufficient documentation

## 2014-05-04 DIAGNOSIS — Z8742 Personal history of other diseases of the female genital tract: Secondary | ICD-10-CM | POA: Insufficient documentation

## 2014-05-04 DIAGNOSIS — Z720 Tobacco use: Secondary | ICD-10-CM | POA: Insufficient documentation

## 2014-05-04 DIAGNOSIS — Z87828 Personal history of other (healed) physical injury and trauma: Secondary | ICD-10-CM | POA: Insufficient documentation

## 2014-05-04 DIAGNOSIS — Z87442 Personal history of urinary calculi: Secondary | ICD-10-CM | POA: Diagnosis not present

## 2014-05-04 DIAGNOSIS — Z79899 Other long term (current) drug therapy: Secondary | ICD-10-CM | POA: Insufficient documentation

## 2014-05-04 MED ORDER — METHOCARBAMOL 500 MG PO TABS: 1000.0000 mg | ORAL_TABLET | Freq: Four times a day (QID) | ORAL | Status: DC | PRN

## 2014-05-04 MED ORDER — MORPHINE SULFATE 4 MG/ML IJ SOLN
4.0000 mg | Freq: Once | INTRAMUSCULAR | Status: AC
  Administered 2014-05-04: 4 mg via INTRAMUSCULAR
  Filled 2014-05-04: qty 1
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
## 2014-05-04 NOTE — ED Notes (Signed)
C/o pain to lower back pain after lifting child's electric car this am-hx of back pain

## 2014-05-04 NOTE — Discharge Instructions (Signed)
For pain control you may take up to  of Motrin (also known as ibuprofen). That is usually 4 over the counter pills,  3 times a day. Take with food to minimize stomach irritation   You can also take  tylenol (acetaminophen)  (this is 3 over the counter pills) four times a day. Do not drink alcohol or combine with other medications that have acetaminophen as an ingredient (Read the labels!).    For breakthrough pain you may take Robaxin. Do not drink alcohol, drive or operate heavy machinery when taking Robaxin. Not combine the Robaxin with the tizanidine.  Please follow with your primary care doctor in the next 2 days for a check-up. They must obtain records for further management.   Do not hesitate to return to the Emergency Department for any new, worsening or concerning symptoms.    Back Pain, Adult Low back pain is very common. About 1 in 5 people have back pain.The cause of low back pain is rarely dangerous. The pain often gets better over time.About half of people with a sudden onset of back pain feel better in just 2 weeks. About 8 in 10 people feel better by 6 weeks.  CAUSES Some common causes of back pain include:  Strain of the muscles or ligaments supporting the spine.  Wear and tear (degeneration) of the spinal discs.  Arthritis.  Direct injury to the back. DIAGNOSIS Most of the time, the direct cause of low back pain is not known.However, back pain can be treated effectively even when the exact cause of the pain is unknown.Answering your caregiver's questions about your overall health and symptoms is one of the most accurate ways to make sure the cause of your pain is not dangerous. If your caregiver needs more information, he or she may order lab work or imaging tests (X-rays or MRIs).However, even if imaging tests show changes in your back, this usually does not require surgery. HOME CARE INSTRUCTIONS For many people, back pain returns.Since low back pain is  rarely dangerous, it is often a condition that people can learn to [facility] their own.   Remain active. It is stressful on the back to sit or stand in one place. Do not sit, drive, or stand in one place for more than 30 minutes at a time. Take short walks on level surfaces as soon as pain allows.Try to increase the length of time you walk each day.  Do not stay in bed.Resting more than 1 or 2 days can delay your recovery.  Do not avoid exercise or work.Your body is made to move.It is not dangerous to be active, even though your back may hurt.Your back will likely heal faster if you return to being active before your pain is gone.  Pay attention to your body when you bend and lift. Many people have less discomfortwhen lifting if they bend their knees, keep the load close to their bodies,and avoid twisting. Often, the most comfortable positions are those that put less stress on your recovering back.  Find a comfortable position to sleep. Use a firm mattress and lie on your side with your knees slightly bent. If you lie on your back, put a pillow under your knees.  Only take over-the-counter or prescription medicines as directed by your caregiver. Over-the-counter medicines to reduce pain and inflammation are often the most helpful.Your caregiver may prescribe muscle relaxant drugs.These medicines help dull your pain so you can more quickly return to your normal activities and healthy exercise.  Put ice on the injured area.  Put ice in a plastic bag.  Place a towel between your skin and the bag.  Leave the ice on for 15-20 minutes, 03-04 times a day for the first 2 to 3 days. After that, ice and heat may be alternated to reduce pain and spasms.  Ask your caregiver about trying back exercises and gentle massage. This may be of some benefit.  Avoid feeling anxious or stressed.Stress increases muscle tension and can worsen back pain.It is important to recognize when you are anxious or  stressed and learn ways to manage it.Exercise is a great option. SEEK MEDICAL CARE IF:  You have pain that is not relieved with rest or medicine.  You have pain that does not improve in 1 week.  You have new symptoms.  You are generally not feeling well. SEEK IMMEDIATE MEDICAL CARE IF:   You have pain that radiates from your back into your legs.  You develop new bowel or bladder control problems.  You have unusual weakness or numbness in your arms or legs.  You develop nausea or vomiting.  You develop abdominal pain.  You feel faint. Document Released: 01/02/2005 Document Revised: 07/04/2011 Document Reviewed: 05/06/2013 [facility] Patient Information 2015 [employer], [place]. This information is not intended to replace advice given to you by your health care provider. Make sure you discuss any questions you have with your health care provider.

## 2014-05-08 ENCOUNTER — Emergency Department (HOSPITAL_BASED_OUTPATIENT_CLINIC_OR_DEPARTMENT_OTHER): Payer: Medicare Other

## 2014-05-08 ENCOUNTER — Encounter (HOSPITAL_BASED_OUTPATIENT_CLINIC_OR_DEPARTMENT_OTHER): Payer: Self-pay | Admitting: *Deleted

## 2014-05-08 ENCOUNTER — Emergency Department (HOSPITAL_BASED_OUTPATIENT_CLINIC_OR_DEPARTMENT_OTHER)
Admission: EM | Admit: 2014-05-08 | Discharge: 2014-05-08 | Disposition: A | Discharge status: Home / Self Care | Payer: Medicare Other | Attending: Emergency Medicine | Admitting: Emergency Medicine

## 2014-05-08 DIAGNOSIS — N898 Other specified noninflammatory disorders of vagina: Secondary | ICD-10-CM | POA: Diagnosis not present

## 2014-05-08 DIAGNOSIS — R102 Pelvic and perineal pain: Secondary | ICD-10-CM | POA: Insufficient documentation

## 2014-05-08 DIAGNOSIS — R1032 Left lower quadrant pain: Secondary | ICD-10-CM | POA: Diagnosis present

## 2014-05-08 DIAGNOSIS — Z87442 Personal history of urinary calculi: Secondary | ICD-10-CM | POA: Diagnosis not present

## 2014-05-08 DIAGNOSIS — Z79899 Other long term (current) drug therapy: Secondary | ICD-10-CM | POA: Diagnosis not present

## 2014-05-08 DIAGNOSIS — Z720 Tobacco use: Secondary | ICD-10-CM | POA: Insufficient documentation

## 2014-05-08 DIAGNOSIS — Z3202 Encounter for pregnancy test, result negative: Secondary | ICD-10-CM | POA: Diagnosis not present

## 2014-05-08 DIAGNOSIS — Z87828 Personal history of other (healed) physical injury and trauma: Secondary | ICD-10-CM | POA: Insufficient documentation

## 2014-05-08 LAB — CBC WITH DIFFERENTIAL/PLATELET
BASOS ABS: 0 10*3/uL (ref 0.0–0.1)
Basophils Relative: 0 % (ref 0–1)
Eosinophils Absolute: 0 10*3/uL (ref 0.0–0.7)
Eosinophils Relative: 0 % (ref 0–5)
HCT: 39.9 % (ref 36.0–46.0)
HEMOGLOBIN: 14.2 g/dL (ref 12.0–15.0)
LYMPHS PCT: 15 % (ref 12–46)
Lymphs Abs: 1.4 10*3/uL (ref 0.7–4.0)
MCH: 32.6 pg (ref 26.0–34.0)
MCHC: 35.6 g/dL (ref 30.0–36.0)
MCV: 91.7 fL (ref 78.0–100.0)
MONOS PCT: 6 % (ref 3–12)
Monocytes Absolute: 0.5 10*3/uL (ref 0.1–1.0)
NEUTROS ABS: 7.2 10*3/uL (ref 1.7–7.7)
Neutrophils Relative %: 79 % — ABNORMAL HIGH (ref 43–77)
Platelets: 218 10*3/uL (ref 150–400)
RBC: 4.35 MIL/uL (ref 3.87–5.11)
RDW: 11.6 % (ref 11.5–15.5)
WBC: 9.1 10*3/uL (ref 4.0–10.5)

## 2014-05-08 LAB — COMPREHENSIVE METABOLIC PANEL
ALT: 34 U/L (ref 0–35)
AST: 24 U/L (ref 0–37)
Albumin: 4.1 g/dL (ref 3.5–5.2)
Alkaline Phosphatase: 62 U/L (ref 39–117)
Anion gap: 7 (ref 5–15)
BILIRUBIN TOTAL: 1.8 mg/dL — AB (ref 0.3–1.2)
BUN: 13 mg/dL (ref 6–23)
CHLORIDE: 103 mmol/L (ref 96–112)
CO2: 26 mmol/L (ref 19–32)
Calcium: 8.7 mg/dL (ref 8.4–10.5)
Creatinine, Ser: 0.77 mg/dL (ref 0.50–1.10)
GFR calc Af Amer: >90 mL/min (ref >90)
GLUCOSE: 110 mg/dL — AB (ref 70–99)
Potassium: 3.5 mmol/L (ref 3.5–5.1)
SODIUM: 136 mmol/L (ref 135–145)
Total Protein: 6.9 g/dL (ref 6.0–8.3)

## 2014-05-08 LAB — WET PREP, GENITAL
Trich, Wet Prep: NONE SEEN
YEAST WET PREP: NONE SEEN

## 2014-05-08 LAB — URINALYSIS, ROUTINE W REFLEX MICROSCOPIC
Bilirubin Urine: NEGATIVE
Glucose, UA: NEGATIVE mg/dL
Ketones, ur: 15 mg/dL — AB
LEUKOCYTES UA: NEGATIVE
Nitrite: NEGATIVE
PROTEIN: NEGATIVE mg/dL
SPECIFIC GRAVITY, URINE: 1.025 (ref 1.005–1.030)
Urobilinogen, UA: 0.2 mg/dL (ref 0.0–1.0)
pH: 6 (ref 5.0–8.0)

## 2014-05-08 LAB — PREGNANCY, URINE: PREG TEST UR: NEGATIVE

## 2014-05-08 LAB — URINE MICROSCOPIC-ADD ON

## 2014-05-08 MED ORDER — OXYCODONE HCL 10 MG PO TABS: 10.0000 mg | ORAL_TABLET | Freq: Three times a day (TID) | ORAL | Status: DC

## 2014-05-08 MED ORDER — MORPHINE SULFATE 4 MG/ML IJ SOLN
4.0000 mg | Freq: Once | INTRAMUSCULAR | Status: AC
  Administered 2014-05-08: 4 mg via INTRAVENOUS
  Filled 2014-05-08: qty 1

## 2014-05-08 MED ORDER — ONDANSETRON HCL 4 MG/2ML IJ SOLN
4.0000 mg | Freq: Once | INTRAMUSCULAR | Status: AC
  Administered 2014-05-08: 4 mg via INTRAVENOUS
  Filled 2014-05-08: qty 2
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
## 2014-05-08 NOTE — Discharge Instructions (Signed)
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
## 2014-05-11 LAB — GC/CHLAMYDIA PROBE AMP (~~LOC~~) NOT AT ARMC
Chlamydia: NEGATIVE
Neisseria Gonorrhea: NEGATIVE

## 2014-06-19 ENCOUNTER — Emergency Department (HOSPITAL_BASED_OUTPATIENT_CLINIC_OR_DEPARTMENT_OTHER)
Admission: EM | Admit: 2014-06-19 | Discharge: 2014-06-19 | Disposition: A | Discharge status: Home / Self Care | Payer: Medicare Other | Attending: Emergency Medicine | Admitting: Emergency Medicine

## 2014-06-19 ENCOUNTER — Encounter (HOSPITAL_BASED_OUTPATIENT_CLINIC_OR_DEPARTMENT_OTHER): Payer: Self-pay

## 2014-06-19 DIAGNOSIS — M545 Low back pain: Secondary | ICD-10-CM | POA: Insufficient documentation

## 2014-06-19 DIAGNOSIS — Z79899 Other long term (current) drug therapy: Secondary | ICD-10-CM | POA: Diagnosis not present

## 2014-06-19 DIAGNOSIS — Z87828 Personal history of other (healed) physical injury and trauma: Secondary | ICD-10-CM | POA: Insufficient documentation

## 2014-06-19 DIAGNOSIS — Z87442 Personal history of urinary calculi: Secondary | ICD-10-CM | POA: Diagnosis not present

## 2014-06-19 DIAGNOSIS — G8929 Other chronic pain: Secondary | ICD-10-CM | POA: Diagnosis not present

## 2014-06-19 DIAGNOSIS — Z87448 Personal history of other diseases of urinary system: Secondary | ICD-10-CM | POA: Diagnosis not present

## 2014-06-19 DIAGNOSIS — Z720 Tobacco use: Secondary | ICD-10-CM | POA: Diagnosis not present

## 2014-06-19 DIAGNOSIS — Z760 Encounter for issue of repeat prescription: Secondary | ICD-10-CM | POA: Diagnosis present
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
## 2014-06-19 NOTE — ED Notes (Addendum)
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 Pt took last pill today and has been under pain management for 2 years. Pt scheduled to see her PCP 8/4 - pt does have appointment 6/17 with another doctor in the same practice - pt reports several pain medication clinics have denied her, states referred to ED for referral to pain management and cortisone injections. Pt states she missed approximately 6 appointments at pain management due to having to receive cortisone injections under anesthesia before being allowed to receive prescriptions and forgetting to remain NPO prior to appointment.

## 2014-06-19 NOTE — Discharge Instructions (Signed)
Chronic Pain °Chronic pain can be defined as pain that is off and on and lasts for 3-6 months or longer. Many things cause chronic pain, which can make it difficult to make a diagnosis. There are many treatment options available for chronic pain. However, finding a treatment that works well for you may require trying various approaches until the right one is found. Many people benefit from a combination of two or more types of treatment to control their pain. °SYMPTOMS  °Chronic pain can occur anywhere in the body and can range from mild to very severe. Some types of chronic pain include: °· Headache. °· Low back pain. °· Cancer pain. °· Arthritis pain. °· Neurogenic pain. This is pain resulting from damage to nerves. ° People with chronic pain may also have other symptoms such as: °· Depression. °· Anger. °· Insomnia. °· Anxiety. °DIAGNOSIS  °Your health care provider will help diagnose your condition over time. In many cases, the initial focus will be on excluding possible conditions that could be causing the pain. Depending on your symptoms, your health care provider may order tests to diagnose your condition. Some of these tests may include:  °· Blood tests.   °· CT scan.   °· MRI.   °· X-rays.   °· Ultrasounds.   °· Nerve conduction studies.   °You may need to see a specialist.  °TREATMENT  °Finding treatment that works well may take time. You may be referred to a pain specialist. He or she may prescribe medicine or therapies, such as:  °· Mindful meditation or yoga. °· Shots (injections) of numbing or pain-relieving medicines into the spine or area of pain. °· Local electrical stimulation. °· Acupuncture.   °· Massage therapy.   °· Aroma, color, light, or sound therapy.   °· Biofeedback.   °· Working with a physical therapist to keep from getting stiff.   °· Regular, gentle exercise.   °· Cognitive or behavioral therapy.   °· Group support.   °Sometimes, surgery may be recommended.  °HOME CARE INSTRUCTIONS   °· Take all medicines as directed by your health care provider.   °· Lessen stress in your life by relaxing and doing things such as listening to calming music.   °· Exercise or be active as directed by your health care provider.   °· Eat a healthy diet and include things such as vegetables, fruits, fish, and lean meats in your diet.   °· Keep all follow-up appointments with your health care provider.   °· Attend a support group with others suffering from chronic pain. °SEEK MEDICAL CARE IF:  °· Your pain gets worse.   °· You develop a new pain that was not there before.   °· You cannot tolerate medicines given to you by your health care provider.   °· You have new symptoms since your last visit with your health care provider.   °SEEK IMMEDIATE MEDICAL CARE IF:  °· You feel weak.   °· You have decreased sensation or numbness.   °· You lose control of bowel or bladder function.   °· Your pain suddenly gets much worse.   °· You develop shaking. °· You develop chills. °· You develop confusion. °· You develop chest pain. °· You develop shortness of breath.   °MAKE SURE YOU: °· Understand these instructions. °· Will watch your condition. °· Will get help right away if you are not doing well or get worse. °Document Released: 09/24/2001 Document Revised: 09/04/2012 Document Reviewed: 06/28/2012 °ExitCare® Patient Information ©2015 [employer]. This information is not intended to replace advice given to you by your health care provider. Make sure you discuss any   questions you have with your health care provider. ° ° °Emergency Department Resource Guide °1) Find a Doctor and Pay Out of Pocket °Although you won't have to find out who is covered by your insurance plan, it is a good idea to ask around and get recommendations. You will then need to call the office and see if the doctor you have chosen will accept you as a new patient and what types of options they offer for patients who are self-pay. Some doctors offer  discounts or will set up payment plans for their patients who do not have insurance, but you will need to ask so you aren't surprised when you get to your appointment. ° °2) Contact Your Local Health Department °Not all health departments have doctors that can see patients for sick visits, but many do, so it is worth a call to see if yours does. If you don't know where your local health department is, you can check in your phone book. The CDC also has a tool to help you locate your state's health department, and many state websites also have listings of all of their local health departments. ° °3) Find a Walk-in Clinic °If your illness is not likely to be very severe or complicated, you may want to try a walk in clinic. These are popping up all over the country in pharmacies, drugstores, and shopping centers. They're usually staffed by nurse practitioners or physician assistants that have been trained to treat common illnesses and complaints. They're usually fairly quick and inexpensive. However, if you have serious medical issues or chronic medical problems, these are probably not your best option. ° °No Primary Care Doctor: °- Call [facility] at  832-8000 - they can help you locate a primary care doctor that  accepts your insurance, provides certain services, etc. °- Physician Referral Service- 1-800-533-3463 ° °Chronic Pain Problems: °Organization         Address  Phone   Notes  °[facility]  (336) 297-2271 Patients need to be referred by their primary care doctor.  ° °Medication Assistance: °Organization         Address  Phone   Notes  °[place] [facility] [address] °[place], [place] [address] (336) 641-8030 --Must be a resident of [place] °-- Must have NO insurance coverage whatsoever (no Medicaid/ Medicare, etc.) °-- The pt. MUST have a primary care doctor that directs their care regularly and follows them in the community °  °[facility]   (866) 331-1348   °[employer]  (888) 892-1162   ° °Agencies that provide inexpensive medical care: °Organization         Address  Phone   Notes  °[facility]  (336) 832-8035   °[facility]    (336) 832-7272   °[facility] [address] °[place], [place] [address] (336) 832-4777   °[facility] [address], °[place] (336) 271-4999   °[facility]    (336) 373-0678   °[facility]    (336) 272-1050   °[facility] ° [address], [place] Phone:  (336) 832-4444, Fax:  (336) 832-4440 Hours of Operation:  9 am - 6 pm, M-F.  Also accepts Medicaid/Medicare and self-pay.  °[facility] ° [address], [place] Phone: (336) 832-3150, Fax: (336) 832-3151. Hours of Operation:  8:30 am - 5:30 pm, M-F.    Also accepts Medicaid and self-pay.  °[facility] [place] [address], [place] Phone: (336) 878-6027   °[facility] [address], [place], [place] (336)723-1848, Ext. 123 Mondays & Thursdays: 7-9 AM.  First 15 patients are seen on a first come, first serve basis. °  ° °Medicaid-accepting [place] Providers: ° °Organization         Address  Phone   Notes  °[facility] [address], [place] (336) 641-2100 Also accepts self-pay patients.  °[facility] [address], [place] ° (336) 856-9996   °[facility] [address], [place] (336) 288-8857   °[facility] [address] [place] [address], [place] (336) 299-7000   °Veita Bland [address], [place]  ° (336) 373-1557 Only accepts [place] Access Medicaid patients after they have their name applied to their card.  ° °Self-Pay (no insurance) in [place]: ° °Organization         Address  Phone   Notes  °Sickle Cell Patients, [facility] [address], [place] (336) 832-1970   °[facility] [address], [place] (336) 832-4400   °[facility] ° [address], [place] (336) 992-4800   °[facility]/Dr. Osei-Bonsu ° [address] [place] [address], [place] or [address], [place] (336) 841-8500 Phone number for both [place] and [place] locations is the same.  °[facility] [address], [place] (336) 299-0000   °[facility] [address] [place] [address], [place] or [address] (336) 852-7530 °(336) 878-2260   °[facility] [address], [place] (336) 350-1642, phone; (336) 294-5005, fax Sees patients 1st and 3rd Saturday of every month.  Must not qualify for public or private insurance (i.e. Medicaid, Medicare, [place] Health Choice, Veterans' Benefits) • Household income should be no more than 200% of the poverty level •The clinic cannot treat you if you are pregnant or think you are pregnant • Sexually transmitted diseases are not treated at the clinic.  ° ° °Dental Care: °Organization         Address  Phone  Notes  °[facility] [address], [place] (336) 641-6152 Accepts children up to age 21 who are enrolled in Medicaid or [place] Health Choice; pregnant women with a Medicaid card; and children who have applied for Medicaid or [place] Health Choice, but were declined, whose parents can pay a reduced fee at time of service.  °[facility] [place]  [address], [place] (336) 641-7733 Accepts children up to age 21 who are enrolled in Medicaid or [place] Health Choice; pregnant women with a Medicaid card; and children who have applied for Medicaid or [place] Health Choice, but were declined, whose parents can pay a reduced fee at time of service.  °[facility] ° [address], [place]  (336) 641-4533 Patients are seen by appointment only. Walk-ins are not accepted. [facility] will see patients 18 years of age and older. °Monday - Tuesday (8am-5pm) °Most Wednesdays (8:30-5pm) °$30 per visit, cash only  °[facility] ° [address], [place] (336) 641-4533 Patients are seen by appointment only. Walk-ins are not accepted. [facility]   will see patients 18 years of age and older. °One Wednesday Evening (Monthly: Volunteer Based).  $30 per visit, cash only  °[facility]  (919) 537-3737 for adults; Children under age 4, call Graduate Pediatric Dentistry at (919) [phone]. Children aged 4-14, please call (919) [phone] to request a pediatric application. ° Dental services are provided in all areas of dental care including fillings, crowns and bridges, complete and partial dentures, implants, gum treatment, root canals, and extractions. Preventive care is also provided. Treatment is provided to both adults and children. °Patients are selected via a lottery and there is often a waiting list. °  °[facility] [address], °[place] ° (336) 763-8833 [email or web address] °  °[facility] [address], [place], [place] (336)723-1848, Ext. 123 Second and Fourth Thursday of each month, opens at 6:30 AM; Clinic ends at 9 AM.  Patients are seen on a first-come first-served basis, and a limited number are seen during each clinic.  ° °[facility] ° [address], [place], [place] (336) 723-7904   Eligibility Requirements °You must have lived in [place], [place], or [place] counties for at least the last three months. °  You cannot be eligible for state or federal sponsored healthcare insurance, including Veterans Administration, Medicaid, or Medicare. °  You generally cannot be eligible for healthcare insurance through your employer.  °  How to apply: °Eligibility screenings are held every Tuesday and Wednesday  afternoon from 1:00 pm until 4:00 pm. You do not need an appointment for the interview!  °[facility] [address], [place], [place] 336-631-2330   °[facility]  336-342-8273   °[facility]  336-703-3100   °[facility]  336-570-6415   ° °Behavioral Health Resources in the Community: °Intensive Outpatient Programs °Organization         Address  Phone  Notes  °[facility] [address], [place], [place] 336-878-6098   °[facility] [address], [place], [place] 336-832-9800   °[facility] [address], [place], [place] ° 336-882-2125   °[facility] [address],  °[place], [place] 1-800-853-5163 or 336-641-4981   °Substance Abuse Resources °Organization         Address  Phone  Notes  °[facility]  336-882-2125   °[facility]  336-784-9470   °[facility]  336-285-9073   °[facility]  336-845-3988   °Residential & Outpatient Substance Abuse Program  1-800-659-3381   °Psychological Services °Organization         Address  Phone  Notes  °[facility]  336- 832-9600   °[facility]  336- 378-7881   °[facility] [address], [place] 1-800-853-5163 or 336-641-4981   ° °Mobile Crisis Teams °Organization         Address  Phone  Notes  °[facility], Mobile Crisis Care Unit  1-877-626-1772   °Assertive °[facility] ° [address] [place], [place] 336-834-9664   °Sharon DeEsch [address] °[place]  336-554-5454   ° °Self-Help/Support Groups °Organization         Address  Phone             Notes  °[facility] - variety of support groups  336- 373-1402 Call for more information  °Narcotics Anonymous (NA), [facility] [address], °  [place]  2 meetings at this location  ° °Residential Treatment  Programs °Organization         Address  Phone  Notes  °[facility] [address],    °[place]  1-866-801-8205   °[facility] ° [address], [place], [place] 704-293-8524   °[facility] [address], [place] 336-845-3988 Admissions: 8am-3pm M-F  °[facility] [address]    °[place], [place] 336-841-1104   °[facility] [address], [place], [place] 336-379-7146   °[facility] [address]  °[place], [place] 336-285-9073   °[facility] Intensive Outpatient [address], [place], [place] 336-852-3033   °[facility] [address]  °[place], [place] 1-877-615-2722 or 336-784-9470   °[facility] [address] [place], [place] 336-227-7417 Accepts Medicaid  °[facility] [address]  °[place] 1-800-659-3381 Substance Abuse/Addiction Treatment  ° °[place] Behavioral Health Resources °Organization         Address  Phone  Notes  °[facility]  (888) 581-9988   °Julie Brannon, PhD [address] [place], [place]   (336) 349-5553 or (336) 951-0000   °[facility]   [address] °[place], [place] (336) 349-4454   °[facility] [address], [place], [place] (336) 342-8316 Insurance/Medicaid/sponsorship through [facility]  °[facility] [address]                                    [place], [place] (336) 342-8316 Therapy/tele-psych/case  °[facility] [address]  ° [place], [place] (336) 349-2233    °Dr. Arfeen  (336) 349-4544   °[facility] [place]  [employer] [place] [facility] 1) [address],  °2) [address], [place] °3)  [address], [place] (336) 349-3220 °(336) 342-7768 ° °(336) 342-8140   °[place] [facility] (336) 342-1394 or (336) 342-3537 (After Hours)    ° ° ° °

## 2014-07-11 ENCOUNTER — Emergency Department (HOSPITAL_BASED_OUTPATIENT_CLINIC_OR_DEPARTMENT_OTHER)
Admission: EM | Admit: 2014-07-11 | Discharge: 2014-07-11 | Disposition: A | Discharge status: Home / Self Care | Payer: Medicare Other | Attending: Emergency Medicine | Admitting: Emergency Medicine

## 2014-07-11 ENCOUNTER — Encounter (HOSPITAL_BASED_OUTPATIENT_CLINIC_OR_DEPARTMENT_OTHER): Payer: Self-pay | Admitting: Emergency Medicine

## 2014-07-11 ENCOUNTER — Emergency Department (HOSPITAL_BASED_OUTPATIENT_CLINIC_OR_DEPARTMENT_OTHER): Payer: Medicare Other

## 2014-07-11 DIAGNOSIS — Z87442 Personal history of urinary calculi: Secondary | ICD-10-CM | POA: Insufficient documentation

## 2014-07-11 DIAGNOSIS — Z79899 Other long term (current) drug therapy: Secondary | ICD-10-CM | POA: Diagnosis not present

## 2014-07-11 DIAGNOSIS — Y998 Other external cause status: Secondary | ICD-10-CM | POA: Insufficient documentation

## 2014-07-11 DIAGNOSIS — S91051A Open bite, right ankle, initial encounter: Secondary | ICD-10-CM | POA: Diagnosis not present

## 2014-07-11 DIAGNOSIS — Y9389 Activity, other specified: Secondary | ICD-10-CM | POA: Insufficient documentation

## 2014-07-11 DIAGNOSIS — W540XXA Bitten by dog, initial encounter: Secondary | ICD-10-CM | POA: Diagnosis not present

## 2014-07-11 DIAGNOSIS — S81851A Open bite, right lower leg, initial encounter: Secondary | ICD-10-CM | POA: Insufficient documentation

## 2014-07-11 DIAGNOSIS — Y9289 Other specified places as the place of occurrence of the external cause: Secondary | ICD-10-CM | POA: Diagnosis not present

## 2014-07-11 DIAGNOSIS — Z23 Encounter for immunization: Secondary | ICD-10-CM | POA: Insufficient documentation

## 2014-07-11 DIAGNOSIS — Z720 Tobacco use: Secondary | ICD-10-CM | POA: Insufficient documentation

## 2014-07-11 MED ORDER — TETANUS-DIPHTH-ACELL PERTUSSIS 5-2.5-18.5 LF-MCG/0.5 IM SUSP
0.5000 mL | Freq: Once | INTRAMUSCULAR | Status: AC
  Administered 2014-07-11: 0.5 mL via INTRAMUSCULAR
  Filled 2014-07-11: qty 0.5

## 2014-07-11 MED ORDER — AMOXICILLIN-POT CLAVULANATE 875-125 MG PO TABS: 1.0000 | ORAL_TABLET | Freq: Two times a day (BID) | ORAL | Status: DC

## 2014-07-11 MED ORDER — OXYCODONE HCL 5 MG PO TABS
5.0000 mg | ORAL_TABLET | Freq: Once | ORAL | Status: AC
  Administered 2014-07-11: 5 mg via ORAL
  Filled 2014-07-11: qty 1
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
## 2014-07-11 NOTE — ED Notes (Signed)
Pt was bitten by a friend's dog this morning, several puncture marks noted to right foot and ankle.

## 2014-12-15 ENCOUNTER — Emergency Department (HOSPITAL_COMMUNITY)
Admission: EM | Admit: 2014-12-15 | Discharge: 2014-12-15 | Discharge status: Against Medical Advice | Payer: Medicare Other | Attending: Emergency Medicine | Admitting: Emergency Medicine

## 2014-12-15 ENCOUNTER — Encounter (HOSPITAL_COMMUNITY): Payer: Self-pay | Admitting: Family Medicine

## 2014-12-15 DIAGNOSIS — F419 Anxiety disorder, unspecified: Secondary | ICD-10-CM | POA: Diagnosis not present

## 2014-12-15 DIAGNOSIS — T364X5A Adverse effect of tetracyclines, initial encounter: Secondary | ICD-10-CM | POA: Diagnosis not present

## 2014-12-15 DIAGNOSIS — R002 Palpitations: Secondary | ICD-10-CM | POA: Diagnosis not present

## 2014-12-15 DIAGNOSIS — F17210 Nicotine dependence, cigarettes, uncomplicated: Secondary | ICD-10-CM | POA: Diagnosis not present

## 2014-12-15 DIAGNOSIS — T464X5A Adverse effect of angiotensin-converting-enzyme inhibitors, initial encounter: Secondary | ICD-10-CM | POA: Diagnosis not present

## 2014-12-15 DIAGNOSIS — R1110 Vomiting, unspecified: Secondary | ICD-10-CM | POA: Insufficient documentation

## 2014-12-15 DIAGNOSIS — R0602 Shortness of breath: Secondary | ICD-10-CM | POA: Insufficient documentation

## 2014-12-15 HISTORY — DX: Papillomavirus as the cause of diseases classified elsewhere: B97.7

## 2014-12-15 NOTE — ED Notes (Signed)
[facility] is from home [facility]. Pt took her first dose of Lisinopril 20mg  at 15:45 and took 2 doses of Doxycycline (for sinus infection). Last dose: 21:30 and vomited immediately after taking medication. Pt reports palpitations, shortness of breath (but had this before, reason for going to PCP today), vomiting twice, and anxious. Pt states she feels better now than when she called EMS.

## 2014-12-15 NOTE — ED Notes (Signed)
Bed: [facility]4 Expected date:  Expected time:  Means of arrival:  Comments: EMS 29 yo female from home, nausea and vomiting

## 2014-12-15 NOTE — ED Notes (Signed)
Pt advised registration that she was leaving and did not want to wait any longer

## 2015-01-13 ENCOUNTER — Encounter (HOSPITAL_BASED_OUTPATIENT_CLINIC_OR_DEPARTMENT_OTHER): Payer: Self-pay | Admitting: Emergency Medicine

## 2015-01-13 ENCOUNTER — Emergency Department (HOSPITAL_BASED_OUTPATIENT_CLINIC_OR_DEPARTMENT_OTHER): Payer: Medicare Other

## 2015-01-13 ENCOUNTER — Emergency Department (HOSPITAL_BASED_OUTPATIENT_CLINIC_OR_DEPARTMENT_OTHER)
Admission: EM | Admit: 2015-01-13 | Discharge: 2015-01-14 | Disposition: A | Discharge status: Home / Self Care | Payer: Medicare Other | Attending: Emergency Medicine | Admitting: Emergency Medicine

## 2015-01-13 DIAGNOSIS — Z79899 Other long term (current) drug therapy: Secondary | ICD-10-CM | POA: Diagnosis not present

## 2015-01-13 DIAGNOSIS — F17210 Nicotine dependence, cigarettes, uncomplicated: Secondary | ICD-10-CM | POA: Diagnosis not present

## 2015-01-13 DIAGNOSIS — Z87448 Personal history of other diseases of urinary system: Secondary | ICD-10-CM | POA: Diagnosis not present

## 2015-01-13 DIAGNOSIS — R002 Palpitations: Secondary | ICD-10-CM | POA: Diagnosis not present

## 2015-01-13 DIAGNOSIS — R42 Dizziness and giddiness: Secondary | ICD-10-CM | POA: Insufficient documentation

## 2015-01-13 DIAGNOSIS — Z87442 Personal history of urinary calculi: Secondary | ICD-10-CM | POA: Diagnosis not present

## 2015-01-13 DIAGNOSIS — R200 Anesthesia of skin: Secondary | ICD-10-CM | POA: Insufficient documentation

## 2015-01-13 DIAGNOSIS — Z8619 Personal history of other infectious and parasitic diseases: Secondary | ICD-10-CM | POA: Insufficient documentation

## 2015-01-13 DIAGNOSIS — Z87828 Personal history of other (healed) physical injury and trauma: Secondary | ICD-10-CM | POA: Diagnosis not present

## 2015-01-13 DIAGNOSIS — R0602 Shortness of breath: Secondary | ICD-10-CM | POA: Diagnosis present

## 2015-01-13 DIAGNOSIS — R0600 Dyspnea, unspecified: Secondary | ICD-10-CM

## 2015-01-13 LAB — BASIC METABOLIC PANEL
Anion gap: 5 (ref 5–15)
BUN: 15 mg/dL (ref 6–20)
CALCIUM: 9.7 mg/dL (ref 8.9–10.3)
CO2: 26 mmol/L (ref 22–32)
CREATININE: 0.76 mg/dL (ref 0.44–1.00)
Chloride: 107 mmol/L (ref 101–111)
GFR calc non Af Amer: >60 mL/min (ref >60)
GLUCOSE: 106 mg/dL — AB (ref 65–99)
Potassium: 4.3 mmol/L (ref 3.5–5.1)
Sodium: 138 mmol/L (ref 135–145)

## 2015-01-13 LAB — CBC
HCT: 40.6 % (ref 36.0–46.0)
Hemoglobin: 14.3 g/dL (ref 12.0–15.0)
MCH: 31.9 pg (ref 26.0–34.0)
MCHC: 35.2 g/dL (ref 30.0–36.0)
MCV: 90.6 fL (ref 78.0–100.0)
PLATELETS: 257 10*3/uL (ref 150–400)
RBC: 4.48 MIL/uL (ref 3.87–5.11)
RDW: 11.3 % — ABNORMAL LOW (ref 11.5–15.5)
WBC: 10.7 10*3/uL — ABNORMAL HIGH (ref 4.0–10.5)

## 2015-01-13 LAB — D-DIMER, QUANTITATIVE: D-Dimer, Quant: <0.27 ug/mL-FEU (ref 0.00–0.50)

## 2015-01-13 LAB — TROPONIN I
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
## 2015-01-13 NOTE — ED Notes (Addendum)
Patient states that she has had problems with her "heart beat feeling like it is not really there and I am SOB and have tingling to my left fingers and right toes. I was referred to a cardiologist and tried to get in earlier but was told to come here". [facility] about 4 weeks ago and left without being seen. First reported that she went to PCP about 2 weeks ago, but then reports that she went to her PCP about 3 -4 days after [facility] she "couldn't remember when she was seen". PAtient as we are in triage adds on more and more heart complaints, and reports "I sometimes feel like my heart is in the wrong position in my chest and I have to lay on my right side for it to feel like it is working right" Noted by this Charity fundraiserN. The patient is very restless in the chair and at times takes multiple deep breaths, and then breaths normally for a while. The Deep breaths are usually post movement in the chair, or resting back into the chair. Patient was ambulatory to the triage room, almost ran to the room when her name was called from the waiting room. No distress noted at this time.

## 2015-01-13 NOTE — ED Notes (Addendum)
Pt to nurse first desk states "I think there is something wrong with my heart. I couldn't get an appointment with a cardiologist so I came here". Pt denies cardiac history- reports symptoms of SOB, Numbness, palpitations- O2 sats 100%, HR 95 and regular per pulse ox. Speaking full sentences without difficulty. Respirations even and unlabored at this time.

## 2015-05-24 MED FILL — SUBOXONE 8 MG-2 MG SL FILM: 8-2 | 7 days supply | Qty: 14 | Fill #0

## 2015-06-03 MED FILL — SUBOXONE 8 MG-2 MG SL FILM: 8-2 | 7 days supply | Qty: 18 | Fill #0

## 2015-06-15 MED FILL — SUBOXONE 8 MG-2 MG SL FILM: 8-2 | 7 days supply | Qty: 18 | Fill #0

## 2015-06-17 ENCOUNTER — Emergency Department (HOSPITAL_BASED_OUTPATIENT_CLINIC_OR_DEPARTMENT_OTHER): Payer: Medicare Other

## 2015-06-17 ENCOUNTER — Emergency Department (HOSPITAL_BASED_OUTPATIENT_CLINIC_OR_DEPARTMENT_OTHER)
Admission: EM | Admit: 2015-06-17 | Discharge: 2015-06-17 | Disposition: A | Discharge status: Home / Self Care | Payer: Medicare Other | Attending: Emergency Medicine | Admitting: Emergency Medicine

## 2015-06-17 ENCOUNTER — Encounter (HOSPITAL_BASED_OUTPATIENT_CLINIC_OR_DEPARTMENT_OTHER): Payer: Self-pay | Admitting: *Deleted

## 2015-06-17 DIAGNOSIS — F0781 Postconcussional syndrome: Secondary | ICD-10-CM | POA: Diagnosis not present

## 2015-06-17 DIAGNOSIS — Y929 Unspecified place or not applicable: Secondary | ICD-10-CM | POA: Insufficient documentation

## 2015-06-17 DIAGNOSIS — R42 Dizziness and giddiness: Secondary | ICD-10-CM

## 2015-06-17 DIAGNOSIS — Y939 Activity, unspecified: Secondary | ICD-10-CM | POA: Diagnosis not present

## 2015-06-17 DIAGNOSIS — S0990XA Unspecified injury of head, initial encounter: Secondary | ICD-10-CM | POA: Diagnosis present

## 2015-06-17 DIAGNOSIS — Y999 Unspecified external cause status: Secondary | ICD-10-CM | POA: Diagnosis not present

## 2015-06-17 DIAGNOSIS — N390 Urinary tract infection, site not specified: Secondary | ICD-10-CM | POA: Insufficient documentation

## 2015-06-17 DIAGNOSIS — F17210 Nicotine dependence, cigarettes, uncomplicated: Secondary | ICD-10-CM | POA: Insufficient documentation

## 2015-06-17 DIAGNOSIS — W109XXA Fall (on) (from) unspecified stairs and steps, initial encounter: Secondary | ICD-10-CM | POA: Diagnosis not present

## 2015-06-17 DIAGNOSIS — S301XXA Contusion of abdominal wall, initial encounter: Secondary | ICD-10-CM | POA: Insufficient documentation

## 2015-06-17 DIAGNOSIS — Z79891 Long term (current) use of opiate analgesic: Secondary | ICD-10-CM | POA: Insufficient documentation

## 2015-06-17 LAB — BASIC METABOLIC PANEL
ANION GAP: 7 (ref 5–15)
BUN: 21 mg/dL — ABNORMAL HIGH (ref 6–20)
CHLORIDE: 106 mmol/L (ref 101–111)
CO2: 23 mmol/L (ref 22–32)
CREATININE: 0.81 mg/dL (ref 0.44–1.00)
Calcium: 8.9 mg/dL (ref 8.9–10.3)
GFR calc non Af Amer: >60 mL/min (ref >60)
GLUCOSE: 102 mg/dL — AB (ref 65–99)
Potassium: 3.7 mmol/L (ref 3.5–5.1)
Sodium: 136 mmol/L (ref 135–145)

## 2015-06-17 LAB — URINALYSIS, ROUTINE W REFLEX MICROSCOPIC
Glucose, UA: NEGATIVE mg/dL
Ketones, ur: 15 mg/dL — AB
Nitrite: NEGATIVE
Protein, ur: NEGATIVE mg/dL
Specific Gravity, Urine: 1.027 (ref 1.005–1.030)
pH: 6 (ref 5.0–8.0)

## 2015-06-17 LAB — CBC WITH DIFFERENTIAL/PLATELET
Basophils Absolute: 0 10*3/uL (ref 0.0–0.1)
Basophils Relative: 0 %
Eosinophils Absolute: 0.2 10*3/uL (ref 0.0–0.7)
Eosinophils Relative: 4 %
HEMATOCRIT: 38.3 % (ref 36.0–46.0)
HEMOGLOBIN: 13.3 g/dL (ref 12.0–15.0)
LYMPHS ABS: 1.2 10*3/uL (ref 0.7–4.0)
Lymphocytes Relative: 24 %
MCH: 32.4 pg (ref 26.0–34.0)
MCHC: 34.7 g/dL (ref 30.0–36.0)
MCV: 93.2 fL (ref 78.0–100.0)
MONO ABS: 0.4 10*3/uL (ref 0.1–1.0)
MONOS PCT: 8 %
NEUTROS ABS: 3.1 10*3/uL (ref 1.7–7.7)
NEUTROS PCT: 64 %
Platelets: 205 10*3/uL (ref 150–400)
RBC: 4.11 MIL/uL (ref 3.87–5.11)
RDW: 12.2 % (ref 11.5–15.5)
WBC: 4.9 10*3/uL (ref 4.0–10.5)

## 2015-06-17 LAB — URINE MICROSCOPIC-ADD ON

## 2015-06-17 LAB — WET PREP, GENITAL
Clue Cells Wet Prep HPF POC: NONE SEEN
SPERM: NONE SEEN
Trich, Wet Prep: NONE SEEN
Yeast Wet Prep HPF POC: NONE SEEN

## 2015-06-17 LAB — PREGNANCY, URINE: Preg Test, Ur: NEGATIVE

## 2015-06-17 MED ORDER — LORAZEPAM 2 MG/ML IJ SOLN
0.5000 mg | Freq: Once | INTRAMUSCULAR | Status: AC
  Administered 2015-06-17: 0.5 mg via INTRAVENOUS
  Filled 2015-06-17: qty 1

## 2015-06-17 MED ORDER — MECLIZINE HCL 25 MG PO TABS
25.0000 mg | ORAL_TABLET | Freq: Once | ORAL | Status: AC
  Administered 2015-06-17: 25 mg via ORAL
  Filled 2015-06-17: qty 1

## 2015-06-17 MED ORDER — CEFTRIAXONE SODIUM 1 G IJ SOLR
1.0000 g | Freq: Once | INTRAMUSCULAR | Status: AC
  Administered 2015-06-17: 1 g via INTRAVENOUS
  Filled 2015-06-17: qty 10

## 2015-06-17 MED ORDER — MECLIZINE HCL 25 MG PO TABS: 25.0000 mg | ORAL_TABLET | Freq: Three times a day (TID) | ORAL | Status: DC | PRN

## 2015-06-17 MED ORDER — IOPAMIDOL (ISOVUE-300) INJECTION 61%
100.0000 mL | Freq: Once | INTRAVENOUS | Status: AC | PRN
  Administered 2015-06-17: 100 mL via INTRAVENOUS

## 2015-06-17 MED ORDER — SODIUM CHLORIDE 0.9 % IV BOLUS (SEPSIS): 1000.0000 mL | Freq: Once | INTRAVENOUS | Status: DC

## 2015-06-17 MED ORDER — SODIUM CHLORIDE 0.9 % IV BOLUS (SEPSIS)
1000.0000 mL | Freq: Once | INTRAVENOUS | Status: AC
  Administered 2015-06-17: 1000 mL via INTRAVENOUS

## 2015-06-17 MED ORDER — SULFAMETHOXAZOLE-TRIMETHOPRIM 800-160 MG PO TABS: 1.0000 | ORAL_TABLET | Freq: Two times a day (BID) | ORAL | Status: DC

## 2015-06-17 NOTE — Discharge Instructions (Signed)
Read the information below.  Use the prescribed medication as directed.  Please discuss all new medications with your pharmacist.  You may return to the Emergency Department at any time for worsening condition or any new symptoms that concern you.    ° °You have had a head injury which does not appear to require admission at this time. A concussion is a state of changed mental ability from trauma. °SEEK IMMEDIATE MEDICAL ATTENTION IF: °There is confusion or drowsiness (although children frequently become drowsy after injury).  °You cannot awaken the injured person.  °There is nausea (feeling sick to your stomach) or continued, forceful vomiting.  °You notice dizziness or unsteadiness which is getting worse, or inability to walk.  °You have convulsions or unconsciousness.  °You experience severe, persistent headaches not relieved by Tylenol?. (Do not take aspirin as this impairs clotting abilities). Take other pain medications only as directed.  °You cannot use arms or legs normally.  °There are changes in pupil sizes. (This is the black center in the colored part of the eye)  °There is clear or bloody discharge from the nose or ears.  °Change in speech, vision, swallowing, or understanding.  °Localized weakness, numbness, tingling, or change in bowel or bladder control.  °

## 2015-06-17 NOTE — ED Notes (Signed)
Pt aware of need for urine sample. States she went before she got here and cannot go again at this time

## 2015-06-17 NOTE — ED Notes (Signed)
PA at bedside.

## 2015-06-17 NOTE — ED Notes (Signed)
States she tripped and fell yesterday am. She hit her head and had positive LOC. She has been dizzy and lethargic since the fall. Vaginal bleeding. States she thinks she was pregnant and may be having a miscarriage. Never had a positive pregnancy test.

## 2015-06-17 NOTE — ED Notes (Signed)
Patient transported to CT 

## 2015-06-17 NOTE — ED Notes (Signed)
Patient transported to CT for ct abd/pelvis
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
## 2015-06-17 NOTE — ED Notes (Signed)
Pt given rx x 2 for antivert and bactrim. D/c home with ride

## 2015-06-18 LAB — HIV ANTIBODY (ROUTINE TESTING W REFLEX): HIV Screen 4th Generation wRfx: NONREACTIVE

## 2015-06-18 LAB — GC/CHLAMYDIA PROBE AMP (~~LOC~~) NOT AT ARMC
CHLAMYDIA, DNA PROBE: NEGATIVE
NEISSERIA GONORRHEA: NEGATIVE

## 2015-06-18 LAB — RPR: RPR Ser Ql: NONREACTIVE

## 2015-06-19 LAB — URINE CULTURE

## 2015-06-22 MED FILL — SUBOXONE 8 MG-2 MG SL FILM: 8-2 | 7 days supply | Qty: 21 | Fill #0

## 2015-06-28 ENCOUNTER — Emergency Department (HOSPITAL_BASED_OUTPATIENT_CLINIC_OR_DEPARTMENT_OTHER)
Admission: EM | Admit: 2015-06-28 | Discharge: 2015-06-28 | Disposition: A | Discharge status: Home / Self Care | Payer: Medicare Other | Attending: Emergency Medicine | Admitting: Emergency Medicine

## 2015-06-28 ENCOUNTER — Encounter (HOSPITAL_BASED_OUTPATIENT_CLINIC_OR_DEPARTMENT_OTHER): Payer: Self-pay | Admitting: *Deleted

## 2015-06-28 DIAGNOSIS — Y939 Activity, unspecified: Secondary | ICD-10-CM | POA: Diagnosis not present

## 2015-06-28 DIAGNOSIS — S0990XA Unspecified injury of head, initial encounter: Secondary | ICD-10-CM | POA: Diagnosis present

## 2015-06-28 DIAGNOSIS — W182XXA Fall in (into) shower or empty bathtub, initial encounter: Secondary | ICD-10-CM | POA: Diagnosis not present

## 2015-06-28 DIAGNOSIS — Y92002 Bathroom of unspecified non-institutional (private) residence single-family (private) house as the place of occurrence of the external cause: Secondary | ICD-10-CM | POA: Insufficient documentation

## 2015-06-28 DIAGNOSIS — F17210 Nicotine dependence, cigarettes, uncomplicated: Secondary | ICD-10-CM | POA: Diagnosis not present

## 2015-06-28 DIAGNOSIS — S01511A Laceration without foreign body of lip, initial encounter: Secondary | ICD-10-CM | POA: Insufficient documentation

## 2015-06-28 DIAGNOSIS — Y999 Unspecified external cause status: Secondary | ICD-10-CM | POA: Diagnosis not present

## 2015-06-28 DIAGNOSIS — S060X1A Concussion with loss of consciousness of 30 minutes or less, initial encounter: Secondary | ICD-10-CM | POA: Diagnosis not present

## 2015-06-28 DIAGNOSIS — R55 Syncope and collapse: Secondary | ICD-10-CM

## 2015-06-28 MED ORDER — DIPHENHYDRAMINE HCL 25 MG PO CAPS
50.0000 mg | ORAL_CAPSULE | Freq: Once | ORAL | Status: AC
  Administered 2015-06-28: 50 mg via ORAL
  Filled 2015-06-28: qty 2

## 2015-06-28 MED ORDER — KETOROLAC TROMETHAMINE 60 MG/2ML IM SOLN
60.0000 mg | Freq: Once | INTRAMUSCULAR | Status: AC
  Administered 2015-06-28: 60 mg via INTRAMUSCULAR
  Filled 2015-06-28: qty 2

## 2015-06-28 MED ORDER — LIDOCAINE-EPINEPHRINE 1 %-1:100000 IJ SOLN
10.0000 mL | Freq: Once | INTRAMUSCULAR | Status: AC
  Administered 2015-06-28: 10 mL via INTRADERMAL
  Filled 2015-06-28: qty 1

## 2015-06-28 MED ORDER — METOCLOPRAMIDE HCL 10 MG PO TABS
10.0000 mg | ORAL_TABLET | Freq: Once | ORAL | Status: AC
  Administered 2015-06-28: 10 mg via ORAL
  Filled 2015-06-28: qty 1

## 2015-06-28 MED ORDER — MECLIZINE HCL 25 MG PO TABS: 25.0000 mg | ORAL_TABLET | Freq: Three times a day (TID) | ORAL | Status: DC | PRN

## 2015-06-28 NOTE — ED Notes (Addendum)
Pt states that she was seen recently for a concussion June 1st after a fall.  States she has been dizzy since then. Has been taking antivert which has helped but she has not taking regularly for the past 2 days. States about 45 min pta she was dizzy while in the bathroom and fell. Hitting her face with pos loc. Laceration noted to left upper lip area. Bleeding controlled. C/o of frontal h/a. Pt is alert and oriented times 3. C/o nausea denies vomiting. C/o "feeling tired" Pt is currently taking suboxone.

## 2015-06-28 NOTE — ED Notes (Signed)
Declined w/c, walked out with steady gait

## 2015-06-28 NOTE — ED Notes (Signed)
Dr. Oni at [facility]. 
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
## 2015-06-28 NOTE — Discharge Instructions (Signed)
Concussion, Adult Amanda Chang, avoid bright lights, TV, smart phones until cleared by your primary care physician regarding your concussion.  Take meclizine as needed for dizziness. If symptoms worsen, come back to the ED immediately. Thank you. A concussion is a brain injury. It is caused by:  A hit to the head.  A quick and sudden movement (jolt) of the head or neck. A concussion is usually not life threatening. Even so, it can cause serious problems. If you had a concussion before, you may have concussion-like problems after a hit to your head. HOME CARE General Instructions  Follow your doctor's directions carefully.  Take medicines only as told by your doctor.  Only take medicines your doctor says are safe.  Do not drink alcohol until your doctor says it is okay. Alcohol and some drugs can slow down healing. They can also put you at risk for further injury.  If you are having trouble remembering things, write them down.  Try to do one thing at a time if you get distracted easily. For example, do not watch TV while making dinner.  Talk to your family members or close friends when making important decisions.  Follow up with your doctor as told.  Watch your symptoms. Tell others to do the same. Serious problems can sometimes happen after a concussion. Older adults are more likely to have these problems.  Tell your teachers, school nurse, school counselor, coach, Event organiser, or work Production designer, theatre/television/film about your concussion. Tell them about what you can or cannot do. They should watch to see if:  It gets even harder for you to pay attention or concentrate.  It gets even harder for you to remember things or learn new things.  You need more time than normal to finish things.  You become annoyed (irritable) more than before.  You are not able to deal with stress as well.  You have more problems than before.  Rest. Make sure you:  Get plenty of sleep at night.  Go to sleep  early.  Go to bed at the same time every day. Try to wake up at the same time.  Rest during the day.  Take naps when you feel tired.  Limit activities where you have to think a lot or concentrate. These include:  Doing homework.  Doing work related to a job.  Watching TV.  Using the computer. Returning To Your Regular Activities Return to your normal activities slowly, not all at once. You must give your body and brain enough time to heal.   Do not play sports or do other athletic activities until your doctor says it is okay.  Ask your doctor when you can drive, ride a bicycle, or work other vehicles or machines. Never do these things if you feel dizzy.  Ask your doctor about when you can return to work or school. Preventing Another Concussion It is very important to avoid another brain injury, especially before you have healed. In rare cases, another injury can lead to permanent brain damage, brain swelling, or death. The risk of this is greatest during the first 7-10 days after your injury. Avoid injuries by:   Wearing a seat belt when riding in a car.  Not drinking too much alcohol.  Avoiding activities that could lead to a second concussion (such as contact sports).  Wearing a helmet when doing activities like:  Biking.  Skiing.  Skateboarding.  Skating.  Making your home safer by:  Removing things from the floor  or stairways that could make you trip.  Using grab bars in bathrooms and handrails by stairs.  Placing non-slip mats on floors and in bathtubs.  Improve lighting in dark areas. GET HELP IF:  It gets even harder for you to pay attention or concentrate.  It gets even harder for you to remember things or learn new things.  You need more time than normal to finish things.  You become annoyed (irritable) more than before.  You are not able to deal with stress as well.  You have more problems than before.  You have problems keeping your  balance.  You are not able to react quickly when you should. Get help if you have any of these problems for more than 2 weeks:   Lasting (chronic) headaches.  Dizziness or trouble balancing.  Feeling sick to your stomach (nausea).  Seeing (vision) problems.  Being affected by noises or light more than normal.  Feeling sad, low, down in the dumps, blue, gloomy, or empty (depressed).  Mood changes (mood swings).  Feeling of fear or nervousness about what may happen (anxiety).  Feeling annoyed.  Memory problems.  Problems concentrating or paying attention.  Sleep problems.  Feeling tired all the time. GET HELP RIGHT AWAY IF:   You have bad headaches or your headaches get worse.  You have weakness (even if it is in one hand, leg, or part of the face).  You have loss of feeling (numbness).  You feel off balance.  You keep throwing up (vomiting).  You feel tired.  One black center of your eye (pupil) is larger than the other.  You twitch or shake violently (convulse).  Your speech is not clear (slurred).  You are more confused, easily angered (agitated), or annoyed than before.  You have more trouble resting than before.  You are unable to recognize people or places.  You have neck pain.  It is difficult to wake you up.  You have unusual behavior changes.  You pass out (lose consciousness). MAKE SURE YOU:   Understand these instructions.  Will watch your condition.  Will get help right away if you are not doing well or get worse.   This information is not intended to replace advice given to you by your health care provider. Make sure you discuss any questions you have with your health care provider.   Document Released: 12/21/2008 Document Revised: 01/23/2014 Document Reviewed: 07/25/2012 Elsevier Interactive Patient Education 2016 [employer] Syncope Syncope means a person passes out (faints). The person usually wakes up in less than 5  minutes. It is important to seek medical care for syncope. HOME CARE  Have someone stay with you until you feel normal.  Do not drive, use machines, or play sports until your doctor says it is okay.  Keep all doctor visits as told.  Lie down when you feel like you might pass out. Take deep breaths. Wait until you feel normal before standing up.  Drink enough fluids to keep your pee (urine) clear or pale yellow.  If you take blood pressure or heart medicine, get up slowly. Take several minutes to sit and then stand. GET HELP RIGHT AWAY IF:   You have a severe headache.  You have pain in the chest, belly (abdomen), or back.  You are bleeding from the mouth or butt (rectum).  You have black or tarry poop (stool).  You have an irregular or very fast heartbeat.  You have pain with breathing.  You  keep passing out, or you have shaking (seizures) when you pass out.  You pass out when sitting or lying down.  You feel confused.  You have trouble walking.  You have severe weakness.  You have vision problems. If you fainted, call for help (911 in [place] Do not drive yourself to the hospital.   This information is not intended to replace advice given to you by your health care provider. Make sure you discuss any questions you have with your health care provider.   Document Released: 06/21/2007 Document Revised: 05/19/2014 Document Reviewed: 03/03/2011 Elsevier Interactive Patient Education [employer]

## 2015-06-29 MED FILL — SUBOXONE 8 MG-2 MG SL FILM: 8-2 | 14 days supply | Qty: 42 | Fill #0

## 2015-07-13 MED FILL — SUBOXONE 8 MG-2 MG SL FILM: 8-2 | 14 days supply | Qty: 42 | Fill #0

## 2015-07-27 MED FILL — SUBOXONE 8 MG-2 MG SL FILM: 8-2 | 28 days supply | Qty: 84 | Fill #0

## 2015-08-30 ENCOUNTER — Emergency Department (HOSPITAL_COMMUNITY): Payer: Medicare Other

## 2015-08-30 ENCOUNTER — Encounter (HOSPITAL_COMMUNITY): Payer: Self-pay

## 2015-08-30 ENCOUNTER — Emergency Department (HOSPITAL_COMMUNITY)
Admission: EM | Admit: 2015-08-30 | Discharge: 2015-08-30 | Discharge status: Against Medical Advice | Payer: Medicare Other | Attending: Emergency Medicine | Admitting: Emergency Medicine

## 2015-08-30 DIAGNOSIS — S8010XA Contusion of unspecified lower leg, initial encounter: Secondary | ICD-10-CM | POA: Diagnosis not present

## 2015-08-30 DIAGNOSIS — S60222A Contusion of left hand, initial encounter: Secondary | ICD-10-CM | POA: Diagnosis not present

## 2015-08-30 DIAGNOSIS — S20229A Contusion of unspecified back wall of thorax, initial encounter: Secondary | ICD-10-CM | POA: Insufficient documentation

## 2015-08-30 DIAGNOSIS — S60221A Contusion of right hand, initial encounter: Secondary | ICD-10-CM | POA: Diagnosis not present

## 2015-08-30 DIAGNOSIS — S6991XA Unspecified injury of right wrist, hand and finger(s), initial encounter: Secondary | ICD-10-CM | POA: Diagnosis present

## 2015-08-30 DIAGNOSIS — Y929 Unspecified place or not applicable: Secondary | ICD-10-CM | POA: Diagnosis not present

## 2015-08-30 DIAGNOSIS — F17210 Nicotine dependence, cigarettes, uncomplicated: Secondary | ICD-10-CM | POA: Diagnosis not present

## 2015-08-30 DIAGNOSIS — Y939 Activity, unspecified: Secondary | ICD-10-CM | POA: Diagnosis not present

## 2015-08-30 DIAGNOSIS — E876 Hypokalemia: Secondary | ICD-10-CM | POA: Insufficient documentation

## 2015-08-30 DIAGNOSIS — Y999 Unspecified external cause status: Secondary | ICD-10-CM | POA: Insufficient documentation

## 2015-08-30 DIAGNOSIS — Z79899 Other long term (current) drug therapy: Secondary | ICD-10-CM | POA: Diagnosis not present

## 2015-08-30 LAB — COMPREHENSIVE METABOLIC PANEL
ALBUMIN: 4.7 g/dL (ref 3.5–5.0)
ALK PHOS: 68 U/L (ref 38–126)
ALT: 20 U/L (ref 14–54)
ANION GAP: 7 (ref 5–15)
AST: 31 U/L (ref 15–41)
BUN: 13 mg/dL (ref 6–20)
CALCIUM: 9.1 mg/dL (ref 8.9–10.3)
CHLORIDE: 102 mmol/L (ref 101–111)
CO2: 26 mmol/L (ref 22–32)
Creatinine, Ser: 0.78 mg/dL (ref 0.44–1.00)
GFR calc Af Amer: >60 mL/min (ref >60)
GFR calc non Af Amer: >60 mL/min (ref >60)
GLUCOSE: 103 mg/dL — AB (ref 65–99)
POTASSIUM: 2.7 mmol/L — AB (ref 3.5–5.1)
SODIUM: 135 mmol/L (ref 135–145)
Total Bilirubin: 3.8 mg/dL — ABNORMAL HIGH (ref 0.3–1.2)
Total Protein: 7.3 g/dL (ref 6.5–8.1)

## 2015-08-30 LAB — COMPREHENSIVE METABOLIC PANEL WITH GFR
ALT: 21 U/L (ref 14–54)
AST: 33 U/L (ref 15–41)
Albumin: 4.7 g/dL (ref 3.5–5.0)
Alkaline Phosphatase: 68 U/L (ref 38–126)
Anion gap: 10 (ref 5–15)
BUN: 13 mg/dL (ref 6–20)
CO2: 23 mmol/L (ref 22–32)
Calcium: 9.2 mg/dL (ref 8.9–10.3)
Chloride: 103 mmol/L (ref 101–111)
Creatinine, Ser: 0.64 mg/dL (ref 0.44–1.00)
GFR calc Af Amer: >60 mL/min
GFR calc non Af Amer: >60 mL/min
Glucose, Bld: 108 mg/dL — ABNORMAL HIGH (ref 65–99)
Potassium: 2.8 mmol/L — ABNORMAL LOW (ref 3.5–5.1)
Sodium: 136 mmol/L (ref 135–145)
Total Bilirubin: 3.8 mg/dL — ABNORMAL HIGH (ref 0.3–1.2)
Total Protein: 7.7 g/dL (ref 6.5–8.1)

## 2015-08-30 LAB — CBC WITH DIFFERENTIAL/PLATELET
BASOS ABS: 0 10*3/uL (ref 0.0–0.1)
BASOS PCT: 0 %
EOS ABS: 0 10*3/uL (ref 0.0–0.7)
Eosinophils Relative: 0 %
HEMATOCRIT: 38.8 % (ref 36.0–46.0)
Hemoglobin: 13.6 g/dL (ref 12.0–15.0)
Lymphocytes Relative: 14 %
Lymphs Abs: 1.7 10*3/uL (ref 0.7–4.0)
MCH: 31.8 pg (ref 26.0–34.0)
MCHC: 35.1 g/dL (ref 30.0–36.0)
MCV: 90.7 fL (ref 78.0–100.0)
MONO ABS: 0.9 10*3/uL (ref 0.1–1.0)
MONOS PCT: 7 %
NEUTROS ABS: 9.8 10*3/uL — AB (ref 1.7–7.7)
Neutrophils Relative %: 79 %
PLATELETS: 286 10*3/uL (ref 150–400)
RBC: 4.28 MIL/uL (ref 3.87–5.11)
RDW: 12.9 % (ref 11.5–15.5)
WBC: 12.4 10*3/uL — ABNORMAL HIGH (ref 4.0–10.5)

## 2015-08-30 LAB — RAPID URINE DRUG SCREEN, HOSP PERFORMED
Amphetamines: POSITIVE — AB
Barbiturates: NOT DETECTED
Benzodiazepines: NOT DETECTED
Cocaine: POSITIVE — AB
Opiates: NOT DETECTED
Tetrahydrocannabinol: NOT DETECTED

## 2015-08-30 LAB — POC URINE PREG, ED: Preg Test, Ur: NEGATIVE

## 2015-08-30 LAB — ACETAMINOPHEN LEVEL: Acetaminophen (Tylenol), Serum: <10 ug/mL — ABNORMAL LOW (ref 10–30)

## 2015-08-30 LAB — SALICYLATE LEVEL: Salicylate Lvl: <4 mg/dL (ref 2.8–30.0)

## 2015-08-30 LAB — BILIRUBIN, DIRECT: Bilirubin, Direct: 0.3 mg/dL (ref 0.1–0.5)

## 2015-08-30 LAB — ETHANOL: Alcohol, Ethyl (B): <5 mg/dL
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
## 2015-08-30 NOTE — ED Notes (Signed)
Patient transported to X-ray 

## 2015-08-30 NOTE — ED Notes (Signed)
Pt requesting to speak to MD about her lab results before US performed. MD at bedside.
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
## 2015-08-30 NOTE — ED Notes (Signed)
US at bedside

## 2015-08-30 NOTE — ED Notes (Signed)
Unable to Triage Pt.  [employer] at bedside.

## 2015-08-30 NOTE — ED Notes (Signed)
Pt left AMA. Ambulatory at d/c. Refused vitals.

## 2015-08-30 NOTE — ED Notes (Signed)
Pt requesting to leave AMA. MD made aware. Pt encouraged to wait for resources.

## 2015-09-02 MED FILL — SUBOXONE 8 MG-2 MG SL FILM: 8-2 | 7 days supply | Qty: 21 | Fill #0

## 2015-09-09 ENCOUNTER — Emergency Department (HOSPITAL_BASED_OUTPATIENT_CLINIC_OR_DEPARTMENT_OTHER)
Admission: EM | Admit: 2015-09-09 | Discharge: 2015-09-09 | Disposition: A | Discharge status: Home / Self Care | Payer: Medicare Other | Attending: Emergency Medicine | Admitting: Emergency Medicine

## 2015-09-09 ENCOUNTER — Encounter (HOSPITAL_BASED_OUTPATIENT_CLINIC_OR_DEPARTMENT_OTHER): Payer: Self-pay | Admitting: *Deleted

## 2015-09-09 DIAGNOSIS — R1084 Generalized abdominal pain: Secondary | ICD-10-CM

## 2015-09-09 DIAGNOSIS — N938 Other specified abnormal uterine and vaginal bleeding: Secondary | ICD-10-CM | POA: Diagnosis not present

## 2015-09-09 DIAGNOSIS — R102 Pelvic and perineal pain: Secondary | ICD-10-CM | POA: Insufficient documentation

## 2015-09-09 DIAGNOSIS — F17210 Nicotine dependence, cigarettes, uncomplicated: Secondary | ICD-10-CM | POA: Diagnosis not present

## 2015-09-09 DIAGNOSIS — Z5181 Encounter for therapeutic drug level monitoring: Secondary | ICD-10-CM | POA: Insufficient documentation

## 2015-09-09 DIAGNOSIS — R42 Dizziness and giddiness: Secondary | ICD-10-CM | POA: Diagnosis not present

## 2015-09-09 LAB — PREGNANCY, URINE: Preg Test, Ur: NEGATIVE

## 2015-09-09 LAB — URINALYSIS, ROUTINE W REFLEX MICROSCOPIC
BILIRUBIN URINE: NEGATIVE
Glucose, UA: NEGATIVE mg/dL
HGB URINE DIPSTICK: NEGATIVE
KETONES UR: NEGATIVE mg/dL
Leukocytes, UA: NEGATIVE
NITRITE: NEGATIVE
PH: 6 (ref 5.0–8.0)
Protein, ur: NEGATIVE mg/dL
Specific Gravity, Urine: 1.011 (ref 1.005–1.030)

## 2015-09-09 LAB — CBC WITH DIFFERENTIAL/PLATELET
Basophils Absolute: 0 10*3/uL (ref 0.0–0.1)
Basophils Relative: 0 %
EOS ABS: 0.1 10*3/uL (ref 0.0–0.7)
EOS PCT: 1 %
HCT: 36.8 % (ref 36.0–46.0)
Hemoglobin: 12.6 g/dL (ref 12.0–15.0)
LYMPHS ABS: 2.2 10*3/uL (ref 0.7–4.0)
Lymphocytes Relative: 34 %
MCH: 32.6 pg (ref 26.0–34.0)
MCHC: 34.2 g/dL (ref 30.0–36.0)
MCV: 95.3 fL (ref 78.0–100.0)
MONO ABS: 0.6 10*3/uL (ref 0.1–1.0)
MONOS PCT: 9 %
Neutro Abs: 3.7 10*3/uL (ref 1.7–7.7)
Neutrophils Relative %: 56 %
PLATELETS: 196 10*3/uL (ref 150–400)
RBC: 3.86 MIL/uL — AB (ref 3.87–5.11)
RDW: 12.9 % (ref 11.5–15.5)
WBC: 6.6 10*3/uL (ref 4.0–10.5)

## 2015-09-09 LAB — WET PREP, GENITAL
Clue Cells Wet Prep HPF POC: NONE SEEN
SPERM: NONE SEEN
Trich, Wet Prep: NONE SEEN
Yeast Wet Prep HPF POC: NONE SEEN

## 2015-09-09 LAB — COMPREHENSIVE METABOLIC PANEL
ALT: 16 U/L (ref 14–54)
ANION GAP: 10 (ref 5–15)
AST: 23 U/L (ref 15–41)
Albumin: 3.5 g/dL (ref 3.5–5.0)
Alkaline Phosphatase: 59 U/L (ref 38–126)
BUN: 16 mg/dL (ref 6–20)
CHLORIDE: 100 mmol/L — AB (ref 101–111)
CO2: 25 mmol/L (ref 22–32)
Calcium: 8.6 mg/dL — ABNORMAL LOW (ref 8.9–10.3)
Creatinine, Ser: 0.74 mg/dL (ref 0.44–1.00)
Glucose, Bld: 109 mg/dL — ABNORMAL HIGH (ref 65–99)
POTASSIUM: 3.3 mmol/L — AB (ref 3.5–5.1)
SODIUM: 135 mmol/L (ref 135–145)
Total Bilirubin: 0.3 mg/dL (ref 0.3–1.2)
Total Protein: 6 g/dL — ABNORMAL LOW (ref 6.5–8.1)

## 2015-09-09 LAB — RAPID URINE DRUG SCREEN, HOSP PERFORMED
AMPHETAMINES: NOT DETECTED
Barbiturates: NOT DETECTED
Benzodiazepines: NOT DETECTED
COCAINE: NOT DETECTED
OPIATES: NOT DETECTED
TETRAHYDROCANNABINOL: NOT DETECTED

## 2015-09-09 LAB — HCG, QUANTITATIVE, PREGNANCY

## 2015-09-09 LAB — LIPASE, BLOOD: LIPASE: 30 U/L (ref 11–51)

## 2015-09-09 MED ORDER — SODIUM CHLORIDE 0.9 % IV BOLUS (SEPSIS)
1000.0000 mL | Freq: Once | INTRAVENOUS | Status: AC
  Administered 2015-09-09: 1000 mL via INTRAVENOUS

## 2015-09-09 MED FILL — SUBOXONE 8 MG-2 MG SL FILM: 8-2 | 7 days supply | Qty: 21 | Fill #0

## 2015-09-09 NOTE — ED Triage Notes (Signed)
States she thinks she has an ectopic pregnancy. She has taken several  home pregnancy test that have been negative. [facility] and had abnormal labs but left AMA.
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
## 2015-09-11 LAB — HEPATITIS PANEL, ACUTE
HCV Ab: <0.1 s/co ratio (ref 0.0–0.9)
HEP B S AG: NEGATIVE
Hep A IgM: NEGATIVE
Hep B C IgM: NEGATIVE

## 2015-09-13 LAB — GC/CHLAMYDIA PROBE AMP (~~LOC~~) NOT AT ARMC
CHLAMYDIA, DNA PROBE: NEGATIVE
Neisseria Gonorrhea: NEGATIVE

## 2015-09-16 MED FILL — SUBOXONE 8 MG-2 MG SL FILM: 8-2 | 14 days supply | Qty: 42 | Fill #0

## 2015-09-30 MED FILL — SUBOXONE 8 MG-2 MG SL FILM: 8-2 | 14 days supply | Qty: 42 | Fill #0

## 2015-10-14 MED FILL — SUBOXONE 8 MG-2 MG SL FILM: 8-2 | 28 days supply | Qty: 84 | Fill #0

## 2015-11-11 MED FILL — SUBOXONE 8 MG-2 MG SL FILM: 8-2 | 21 days supply | Qty: 63 | Fill #0

## 2015-12-02 MED FILL — SUBOXONE 8 MG-2 MG SL FILM: 8-2 | 28 days supply | Qty: 84 | Fill #0

## 2015-12-28 MED FILL — SUBOXONE 8 MG-2 MG SL FILM: 8-2 | 28 days supply | Qty: 84 | Fill #0

## 2016-01-25 MED FILL — SUBOXONE 8 MG-2 MG SL FILM: 8-2 | 28 days supply | Qty: 84 | Fill #0

## 2016-02-22 MED FILL — SUBOXONE 8 MG-2 MG SL FILM: 8-2 | 28 days supply | Qty: 84 | Fill #0

## 2016-03-16 MED FILL — SUBOXONE 8 MG-2 MG SL FILM: 8-2 | 28 days supply | Qty: 84 | Fill #0

## 2016-04-13 MED FILL — SUBOXONE 8 MG-2 MG SL FILM: 8-2 | 28 days supply | Qty: 84 | Fill #0

## 2016-05-11 MED FILL — SUBOXONE 8 MG-2 MG SL FILM: 8-2 | 28 days supply | Qty: 84 | Fill #0

## 2016-06-08 MED FILL — SUBOXONE 8 MG-2 MG SL FILM: 8-2 | 28 days supply | Qty: 84 | Fill #0

## 2016-07-24 MED FILL — SUBOXONE 8 MG-2 MG SL FILM: 8-2 | 28 days supply | Qty: 84 | Fill #0

## 2016-08-24 MED FILL — SUBOXONE 8 MG-2 MG SL FILM: 8-2 | 28 days supply | Qty: 84 | Fill #0

## 2016-10-15 ENCOUNTER — Encounter (HOSPITAL_COMMUNITY): Payer: Self-pay | Admitting: Behavioral Health

## 2016-10-15 ENCOUNTER — Ambulatory Visit (HOSPITAL_COMMUNITY)
Admission: RE | Admit: 2016-10-15 | Discharge: 2016-10-15 | Disposition: A | Discharge status: Home / Self Care | Payer: Medicare Other | Attending: Psychiatry | Admitting: Psychiatry

## 2016-10-15 DIAGNOSIS — Z915 Personal history of self-harm: Secondary | ICD-10-CM | POA: Diagnosis not present

## 2016-10-15 DIAGNOSIS — F17210 Nicotine dependence, cigarettes, uncomplicated: Secondary | ICD-10-CM | POA: Insufficient documentation

## 2016-10-15 DIAGNOSIS — Z888 Allergy status to other drugs, medicaments and biological substances status: Secondary | ICD-10-CM | POA: Diagnosis not present

## 2016-10-15 DIAGNOSIS — Z87442 Personal history of urinary calculi: Secondary | ICD-10-CM | POA: Insufficient documentation

## 2016-10-15 DIAGNOSIS — F1110 Opioid abuse, uncomplicated: Secondary | ICD-10-CM | POA: Insufficient documentation

## 2016-10-15 DIAGNOSIS — N289 Disorder of kidney and ureter, unspecified: Secondary | ICD-10-CM | POA: Diagnosis not present
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
## 2016-10-15 NOTE — H&P (Signed)
Behavioral Health Medical Screening Exam  Amanda Chang is an 31 y.o. female who arrived to [facility] voluntarily unaccompanied requesting to be admitted for a couple of days to stay away from drugs. She stating that she was sober for 3 years and relapsed yesterday. She stated that her last drug use was today around 2pm. Patient voicing passive SI but not specific plans. Patient was recently discharged from [facility], she said she were not able to follow up with the OP recommendations due to lack of transportation.  Total Time spent with patient: 30 minutes  Psychiatric Specialty Exam: Physical Exam  Constitutional: She is oriented to person, place, and time. She appears well-developed and well-nourished.  HENT:  Head: Normocephalic and atraumatic.  Eyes: Pupils are equal, round, and reactive to light.  Neck: Normal range of motion.  Cardiovascular: Regular rhythm and normal heart sounds.   Pulse 131  Respiratory: Effort normal and breath sounds normal.  GI: Soft. Bowel sounds are normal.  Genitourinary:  Genitourinary Comments: Deferred  Musculoskeletal: Normal range of motion.  Neurological: She is alert and oriented to person, place, and time.  Skin: Skin is warm and dry.    Review of Systems  Psychiatric/Behavioral: Negative for depression and hallucinations. Substance abuse: chronic. Suicidal ideas: passive. The patient is not nervous/anxious.   All other systems reviewed and are negative.   Blood pressure 127/84, pulse (!) 131, temperature 98.9 F (37.2 C), temperature source Oral, resp. rate 18, SpO2 100 %.There is no height or weight on file to calculate BMI.  General Appearance: Casual  Eye Contact:  Good  Speech:  Clear and Coherent and Normal Rate  Volume:  Normal  Mood:  Euthymic  Affect:  Congruent  Thought Process:  Coherent and Goal Directed  Orientation:  Full (Time, Place, and Person)  Thought Content:  WDL and Logical  Suicidal Thoughts:  chronic,  passice ideations  Homicidal Thoughts:  No  Memory:  Immediate;   Good Recent;   Good Remote;   Fair  Judgement:  Intact  Insight:  Good and Present  Psychomotor Activity:  Normal  Concentration: Concentration: Good and Attention Span: Good  Recall:  Good  Fund of Knowledge:Good  Language: Good  Akathisia:  Negative  Handed:  Right  AIMS (if indicated):     Assets:  Communication Skills Desire for Improvement Financial Resources/Insurance Housing Intimacy Resilience Social Support  Sleep:       Musculoskeletal: Strength & Muscle Tone: within normal limits Gait & Station: normal Patient leans: N/A  Blood pressure 127/84, pulse (!) 131, temperature 98.9 F (37.2 C), temperature source Oral, resp. rate 18, SpO2 100 %.  Recommendations:  Based on my evaluation the patient does not appear to have an emergency medical condition.  Delila Pereyra, NP 10/15/2016, 5:13 PM

## 2016-10-16 ENCOUNTER — Emergency Department (HOSPITAL_COMMUNITY)
Admission: EM | Admit: 2016-10-16 | Discharge: 2016-10-16 | Discharge status: Against Medical Advice | Payer: Medicare Other

## 2016-10-16 ENCOUNTER — Ambulatory Visit (HOSPITAL_COMMUNITY)
Admission: RE | Admit: 2016-10-16 | Discharge: 2016-10-16 | Disposition: A | Discharge status: Home / Self Care | Payer: Medicare Other | Attending: Psychiatry | Admitting: Psychiatry

## 2016-10-16 ENCOUNTER — Encounter (HOSPITAL_COMMUNITY): Payer: Self-pay | Admitting: Emergency Medicine

## 2016-10-16 ENCOUNTER — Encounter (HOSPITAL_COMMUNITY): Payer: Self-pay | Admitting: *Deleted

## 2016-10-16 DIAGNOSIS — F1120 Opioid dependence, uncomplicated: Secondary | ICD-10-CM | POA: Insufficient documentation

## 2016-10-16 DIAGNOSIS — F333 Major depressive disorder, recurrent, severe with psychotic symptoms: Secondary | ICD-10-CM | POA: Insufficient documentation

## 2016-10-16 DIAGNOSIS — Z1330 Encounter for screening examination for mental health and behavioral disorders, unspecified: Secondary | ICD-10-CM | POA: Insufficient documentation

## 2016-10-16 NOTE — ED Notes (Signed)
Pt called,no answer.

## 2016-10-16 NOTE — ED Triage Notes (Deleted)
Per EMS, pts mother called due EMS due to pt wanting to kill herself. EMS states pt said she wanted to hurt herself by taking pills. Pt is in police custody.

## 2016-10-16 NOTE — ED Triage Notes (Signed)
Called [facility], pt is in [facility] lobby.
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
## 2016-10-16 NOTE — ED Triage Notes (Signed)
Pt called,no answer.

## 2016-10-16 NOTE — ED Notes (Signed)
Attempted to call pt's phone listed in chart, no answer.

## 2016-10-18 ENCOUNTER — Emergency Department (HOSPITAL_COMMUNITY)
Admission: EM | Admit: 2016-10-18 | Discharge: 2016-10-18 | Disposition: A | Discharge status: Other Acute Inpatient Hospital | Payer: Medicare Other | Attending: Emergency Medicine | Admitting: Emergency Medicine

## 2016-10-18 ENCOUNTER — Encounter (HOSPITAL_COMMUNITY): Payer: Self-pay | Admitting: *Deleted

## 2016-10-18 DIAGNOSIS — R440 Auditory hallucinations: Secondary | ICD-10-CM | POA: Insufficient documentation

## 2016-10-18 DIAGNOSIS — Z79899 Other long term (current) drug therapy: Secondary | ICD-10-CM | POA: Insufficient documentation

## 2016-10-18 DIAGNOSIS — F22 Delusional disorders: Secondary | ICD-10-CM | POA: Diagnosis present

## 2016-10-18 DIAGNOSIS — Z7982 Long term (current) use of aspirin: Secondary | ICD-10-CM | POA: Insufficient documentation

## 2016-10-18 DIAGNOSIS — R45851 Suicidal ideations: Secondary | ICD-10-CM | POA: Insufficient documentation

## 2016-10-18 DIAGNOSIS — F17210 Nicotine dependence, cigarettes, uncomplicated: Secondary | ICD-10-CM | POA: Insufficient documentation

## 2016-10-18 LAB — CBC WITH DIFFERENTIAL/PLATELET
BASOS ABS: 0 10*3/uL (ref 0.0–0.1)
Basophils Relative: 0 %
EOS PCT: 0 %
Eosinophils Absolute: 0 10*3/uL (ref 0.0–0.7)
HCT: 38.6 % (ref 36.0–46.0)
Hemoglobin: 12.7 g/dL (ref 12.0–15.0)
LYMPHS ABS: 1.8 10*3/uL (ref 0.7–4.0)
LYMPHS PCT: 24 %
MCH: 31.7 pg (ref 26.0–34.0)
MCHC: 32.9 g/dL (ref 30.0–36.0)
MCV: 96.3 fL (ref 78.0–100.0)
Monocytes Absolute: 0.5 10*3/uL (ref 0.1–1.0)
Monocytes Relative: 7 %
NEUTROS ABS: 5.1 10*3/uL (ref 1.7–7.7)
Neutrophils Relative %: 69 %
PLATELETS: 199 10*3/uL (ref 150–400)
RBC: 4.01 MIL/uL (ref 3.87–5.11)
RDW: 13.8 % (ref 11.5–15.5)
WBC: 7.4 10*3/uL (ref 4.0–10.5)

## 2016-10-18 LAB — COMPREHENSIVE METABOLIC PANEL
ALK PHOS: 54 U/L (ref 38–126)
ALT: 73 U/L — ABNORMAL HIGH (ref 14–54)
ANION GAP: 11 (ref 5–15)
AST: 37 U/L (ref 15–41)
Albumin: 4 g/dL (ref 3.5–5.0)
BILIRUBIN TOTAL: 0.6 mg/dL (ref 0.3–1.2)
BUN: 32 mg/dL — ABNORMAL HIGH (ref 6–20)
CO2: 26 mmol/L (ref 22–32)
Calcium: 9.6 mg/dL (ref 8.9–10.3)
Chloride: 102 mmol/L (ref 101–111)
Creatinine, Ser: 0.89 mg/dL (ref 0.44–1.00)
GFR calc Af Amer: >60 mL/min (ref >60)
Glucose, Bld: 92 mg/dL (ref 65–99)
POTASSIUM: 3.4 mmol/L — AB (ref 3.5–5.1)
Sodium: 139 mmol/L (ref 135–145)
TOTAL PROTEIN: 7.1 g/dL (ref 6.5–8.1)

## 2016-10-18 LAB — RAPID URINE DRUG SCREEN, HOSP PERFORMED
Amphetamines: NOT DETECTED
BARBITURATES: NOT DETECTED
BENZODIAZEPINES: NOT DETECTED
COCAINE: NOT DETECTED
Opiates: NOT DETECTED
Tetrahydrocannabinol: POSITIVE — AB

## 2016-10-18 LAB — I-STAT BETA HCG BLOOD, ED (MC, WL, AP ONLY)

## 2016-10-18 LAB — ETHANOL
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
## 2016-10-18 NOTE — BH Assessment (Signed)
10/18/2016: Per Elta Guadeloupe, NP, patient meets criteria for INPT treatment. Pending TTS placement. Patient accepted to [facility]. The accepting provider is Lyndel Pleasure, [place] and the attending provider is Erling Conte, [place]. Nursing report [phone]. Patient's bed assignment will be assigned once she arrives. Patient is voluntary and pending [facility] transport.
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
## 2016-10-18 NOTE — ED Triage Notes (Signed)
Patient is alert and oriented x4 with delusions.   Patient states that she is wanting help with the voices in her head.  The voices tell her  "drive the car slow and wreck it when she gets where she is going"  She denies any pain

## 2016-10-18 NOTE — ED Notes (Signed)
TTS at bedside. 

## 2016-10-31 MED FILL — SUBOXONE 8 MG-2 MG SL FILM: 8-2 | 28 days supply | Qty: 84 | Fill #0

## 2018-05-11 ENCOUNTER — Encounter (HOSPITAL_COMMUNITY): Payer: Self-pay | Admitting: Emergency Medicine

## 2018-05-11 ENCOUNTER — Encounter (HOSPITAL_BASED_OUTPATIENT_CLINIC_OR_DEPARTMENT_OTHER): Payer: Self-pay | Admitting: Emergency Medicine

## 2018-05-11 ENCOUNTER — Emergency Department (HOSPITAL_COMMUNITY)
Admission: EM | Admit: 2018-05-11 | Discharge: 2018-05-11 | Discharge status: Against Medical Advice | Payer: Medicare Other | Source: Home / Self Care | Attending: Emergency Medicine | Admitting: Emergency Medicine

## 2018-05-11 ENCOUNTER — Other Ambulatory Visit: Payer: Self-pay

## 2018-05-11 ENCOUNTER — Emergency Department (HOSPITAL_BASED_OUTPATIENT_CLINIC_OR_DEPARTMENT_OTHER)
Admission: EM | Admit: 2018-05-11 | Discharge: 2018-05-11 | Disposition: A | Discharge status: Home / Self Care | Payer: Medicare Other | Attending: Emergency Medicine | Admitting: Emergency Medicine

## 2018-05-11 DIAGNOSIS — F419 Anxiety disorder, unspecified: Secondary | ICD-10-CM | POA: Diagnosis not present

## 2018-05-11 DIAGNOSIS — F5105 Insomnia due to other mental disorder: Secondary | ICD-10-CM | POA: Insufficient documentation

## 2018-05-11 DIAGNOSIS — Z79899 Other long term (current) drug therapy: Secondary | ICD-10-CM

## 2018-05-11 DIAGNOSIS — F17210 Nicotine dependence, cigarettes, uncomplicated: Secondary | ICD-10-CM

## 2018-05-11 DIAGNOSIS — F409 Phobic anxiety disorder, unspecified: Secondary | ICD-10-CM

## 2018-05-11 DIAGNOSIS — Z7982 Long term (current) use of aspirin: Secondary | ICD-10-CM | POA: Diagnosis not present

## 2018-05-11 DIAGNOSIS — G4700 Insomnia, unspecified: Secondary | ICD-10-CM

## 2018-05-11 LAB — PREGNANCY, URINE: Preg Test, Ur: NEGATIVE

## 2018-05-11 LAB — RAPID URINE DRUG SCREEN, HOSP PERFORMED
Amphetamines: NOT DETECTED
Barbiturates: NOT DETECTED
Benzodiazepines: NOT DETECTED
Cocaine: NOT DETECTED
Opiates: NOT DETECTED
Tetrahydrocannabinol: POSITIVE — AB

## 2018-05-11 MED ORDER — HYDROXYZINE HCL 25 MG PO TABS: 25.0000 mg | ORAL_TABLET | Freq: Four times a day (QID) | ORAL | 0 refills | Status: DC | PRN

## 2018-05-11 MED ORDER — SODIUM CHLORIDE 0.9 % IV BOLUS
1000.0000 mL | Freq: Once | INTRAVENOUS | Status: AC
  Administered 2018-05-11: 08:00:00 1000 mL via INTRAVENOUS
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
## 2018-05-11 NOTE — ED Triage Notes (Signed)
On arrival to room Pt jumped off EMS stretcher and walked to room unassisted.  Pt walking in room .

## 2018-05-11 NOTE — ED Triage Notes (Signed)
Pt states that she has been able to sleep in 5 days. States she has not tried anything for sleep. Denies illicit drug use.

## 2018-05-11 NOTE — ED Notes (Signed)
Patient making the bed upon staff entering the room.
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
## 2018-05-11 NOTE — ED Triage Notes (Signed)
Pt arrives via [facility] from home for c/o being unable to sleep for the past 3-4 days. Pt reports no sleep during that time. She denies pain and no recent stress or anxiety. Reports she took two tizanidine to help her sleep. Pt reported to EMS that she used marijuana this am but denies drug use to ED staff. Patient recently d/c from [facility] this am for same complaint.

## 2018-05-11 NOTE — ED Notes (Signed)
ED Provider at bedside. 

## 2018-05-11 NOTE — ED Notes (Signed)
Security was able to locate patient who was brought back into the ED where IV catheter was still taped to patient's arm but dislodged from vein. This RN removed tape and catheter that was fully intact. Security escorted patient back out to her ride.

## 2018-05-11 NOTE — ED Triage Notes (Signed)
Pt states that she feels there is a heavy weight on the right side of her head and that her eye balls cant stay straight. States she has been pacing a lot and complains of low back pain and muscle pain in legs

## 2018-05-11 NOTE — ED Notes (Addendum)
Patient seen exiting ED by other staff members. IV fluids were found on the floor, unsure if patient removed her own iv prior to eloping or not. Attempted to call patient at [phone] without any response. Security made aware and attempting to see if patient remains on campus somewhere.

## 2018-05-11 NOTE — ED Notes (Signed)
Pt understood dc material. NAD noted. Script given at dc. All questions answered to satisfaction. Pt escorted to check out counter 

## 2018-05-22 ENCOUNTER — Emergency Department (HOSPITAL_COMMUNITY)
Admission: EM | Admit: 2018-05-22 | Discharge: 2018-05-23 | Disposition: A | Discharge status: Other Acute Inpatient Hospital | Payer: Medicare Other | Attending: Emergency Medicine | Admitting: Emergency Medicine

## 2018-05-22 ENCOUNTER — Other Ambulatory Visit: Payer: Self-pay

## 2018-05-22 ENCOUNTER — Encounter (HOSPITAL_COMMUNITY): Payer: Self-pay | Admitting: Emergency Medicine

## 2018-05-22 DIAGNOSIS — F17210 Nicotine dependence, cigarettes, uncomplicated: Secondary | ICD-10-CM | POA: Insufficient documentation

## 2018-05-22 DIAGNOSIS — F209 Schizophrenia, unspecified: Secondary | ICD-10-CM | POA: Insufficient documentation

## 2018-05-22 DIAGNOSIS — F22 Delusional disorders: Secondary | ICD-10-CM | POA: Diagnosis present

## 2018-05-22 DIAGNOSIS — F329 Major depressive disorder, single episode, unspecified: Secondary | ICD-10-CM | POA: Insufficient documentation

## 2018-05-22 DIAGNOSIS — Z79899 Other long term (current) drug therapy: Secondary | ICD-10-CM | POA: Insufficient documentation

## 2018-05-22 LAB — CBC WITH DIFFERENTIAL/PLATELET
Abs Immature Granulocytes: 0.03 10*3/uL (ref 0.00–0.07)
Basophils Absolute: 0 10*3/uL (ref 0.0–0.1)
Basophils Relative: 0 %
Eosinophils Absolute: 0 10*3/uL (ref 0.0–0.5)
Eosinophils Relative: 0 %
HCT: 42.2 % (ref 36.0–46.0)
Hemoglobin: 14.2 g/dL (ref 12.0–15.0)
Immature Granulocytes: 0 %
Lymphocytes Relative: 22 %
Lymphs Abs: 2.1 10*3/uL (ref 0.7–4.0)
MCH: 33 pg (ref 26.0–34.0)
MCHC: 33.6 g/dL (ref 30.0–36.0)
MCV: 98.1 fL (ref 80.0–100.0)
Monocytes Absolute: 0.6 10*3/uL (ref 0.1–1.0)
Monocytes Relative: 7 %
Neutro Abs: 6.7 10*3/uL (ref 1.7–7.7)
Neutrophils Relative %: 71 %
Platelets: 199 10*3/uL (ref 150–400)
RBC: 4.3 MIL/uL (ref 3.87–5.11)
RDW: 12.5 % (ref 11.5–15.5)
WBC: 9.5 10*3/uL (ref 4.0–10.5)
nRBC: 0 % (ref 0.0–0.2)

## 2018-05-22 LAB — PREGNANCY, URINE: Preg Test, Ur: NEGATIVE

## 2018-05-22 MED ORDER — ASPIRIN EC 81 MG PO TBEC: 81.0000 mg | DELAYED_RELEASE_TABLET | Freq: Four times a day (QID) | ORAL | Status: DC | PRN

## 2018-05-22 MED ORDER — HYDROXYZINE HCL 25 MG PO TABS
25.0000 mg | ORAL_TABLET | Freq: Four times a day (QID) | ORAL | Status: DC | PRN
  Administered 2018-05-23: 01:00:00 25 mg via ORAL
  Filled 2018-05-22: qty 1

## 2018-05-22 MED ORDER — OLANZAPINE 10 MG PO TBDP
10.0000 mg | ORAL_TABLET | Freq: Every day | ORAL | Status: DC
  Administered 2018-05-22: 10 mg via ORAL
  Filled 2018-05-22: qty 1

## 2018-05-22 MED ORDER — HYDROCHLOROTHIAZIDE 25 MG PO TABS
25.0000 mg | ORAL_TABLET | Freq: Every day | ORAL | Status: DC
  Filled 2018-05-22 (×2): qty 1

## 2018-05-22 MED ORDER — DIVALPROEX SODIUM ER 500 MG PO TB24: 1000.0000 mg | ORAL_TABLET | Freq: Every day | ORAL | Status: DC

## 2018-05-22 MED ORDER — OLANZAPINE 10 MG PO TABS
10.0000 mg | ORAL_TABLET | Freq: Every day | ORAL | Status: DC
  Administered 2018-05-22: 16:00:00 10 mg via ORAL
  Filled 2018-05-22: qty 1

## 2018-05-22 MED ORDER — TRAZODONE HCL 50 MG PO TABS
50.0000 mg | ORAL_TABLET | Freq: Every day | ORAL | Status: DC
  Administered 2018-05-22: 22:00:00 50 mg via ORAL
  Filled 2018-05-22: qty 1

## 2018-05-22 MED ORDER — OLANZAPINE 10 MG PO TABS
10.0000 mg | ORAL_TABLET | Freq: Every day | ORAL | Status: DC
  Administered 2018-05-23: 09:00:00 10 mg via ORAL
  Filled 2018-05-22: qty 1

## 2018-05-22 NOTE — ED Notes (Signed)
Re-searched pt and there was not in contraband on her person. She was not wearing underware. Security wanded the pt.

## 2018-05-22 NOTE — Progress Notes (Signed)
Pt meets inpatient criteria per Assunta Found, NP. Referral information has been sent to the following hospitals for review:  [facility]  [facility]  [facility]  [facility]  [facility]  [facility]  [facility]   Disposition will continue to assist with inpatient placement needs.   Wells Guiles, LCSW, LCAS Disposition CSW [facility]/TTS [phone]
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
## 2018-05-22 NOTE — ED Notes (Signed)
Bed: WA29 Expected date:  Expected time:  Means of arrival:  Comments: Hold for triage 1

## 2018-05-22 NOTE — ED Notes (Signed)
Urine sent for u-preg
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
## 2018-05-22 NOTE — ED Notes (Signed)
Pt denies SI/HI/AVH. Does not appear to be responding to internal stimuli, but is pacing the room and reports believing that people are after her and the FBI is not helping her.  Wanted to shave her arm-pits under supervision, "Before going to the other hospital." Offered and given a snack. Oriented to her surroundings.

## 2018-05-22 NOTE — ED Notes (Signed)
Pt refused medication, Dr.Floyd made aware.

## 2018-05-23 DIAGNOSIS — F209 Schizophrenia, unspecified: Secondary | ICD-10-CM | POA: Diagnosis not present

## 2018-05-23 LAB — ETHANOL: Alcohol, Ethyl (B): <10 mg/dL (ref <10)

## 2018-05-23 LAB — COMPREHENSIVE METABOLIC PANEL
ALT: 14 U/L (ref 0–44)
AST: 17 U/L (ref 15–41)
Albumin: 4.3 g/dL (ref 3.5–5.0)
Alkaline Phosphatase: 41 U/L (ref 38–126)
Anion gap: 10 (ref 5–15)
BUN: 14 mg/dL (ref 6–20)
CO2: 21 mmol/L — ABNORMAL LOW (ref 22–32)
Calcium: 9 mg/dL (ref 8.9–10.3)
Chloride: 106 mmol/L (ref 98–111)
Creatinine, Ser: 0.7 mg/dL (ref 0.44–1.00)
GFR calc Af Amer: >60 mL/min (ref >60)
GFR calc non Af Amer: >60 mL/min (ref >60)
Glucose, Bld: 100 mg/dL — ABNORMAL HIGH (ref 70–99)
Potassium: 3.5 mmol/L (ref 3.5–5.1)
Sodium: 137 mmol/L (ref 135–145)
Total Bilirubin: 1 mg/dL (ref 0.3–1.2)
Total Protein: 7 g/dL (ref 6.5–8.1)

## 2018-05-23 LAB — RAPID URINE DRUG SCREEN, HOSP PERFORMED
Amphetamines: NOT DETECTED
Barbiturates: NOT DETECTED
Benzodiazepines: NOT DETECTED
Cocaine: NOT DETECTED
Opiates: NOT DETECTED
Tetrahydrocannabinol: POSITIVE — AB

## 2018-05-23 NOTE — ED Notes (Signed)
Pt DCd off unit to [facility]. Emtala completed by Dr and RN. Pt cooperative, no s/s of distress. Report given to [place] at [facility]. Pt DC information given to transportation ([place] ).pt belongings given to pt.  Pt ambulatory off unit escorted by tech. Pt transported by [employer]

## 2018-05-23 NOTE — BH Assessment (Signed)
[facility] Assessment Progress Note   Hazel with [facility] said that patient has been accepted to their facility.  Dr. Lorne Skeens, NP accepting.  Nurse call report to [phone] on the [place] unit.  Can arrive after 09:00.

## 2018-06-17 ENCOUNTER — Encounter (HOSPITAL_COMMUNITY): Payer: Self-pay | Admitting: *Deleted

## 2018-06-17 ENCOUNTER — Other Ambulatory Visit: Payer: Self-pay

## 2018-06-17 ENCOUNTER — Emergency Department (HOSPITAL_COMMUNITY)
Admission: EM | Admit: 2018-06-17 | Discharge: 2018-06-17 | Disposition: A | Discharge status: Home / Self Care | Payer: Medicare Other | Attending: Emergency Medicine | Admitting: Emergency Medicine

## 2018-06-17 DIAGNOSIS — G4700 Insomnia, unspecified: Secondary | ICD-10-CM | POA: Insufficient documentation

## 2018-06-17 DIAGNOSIS — Z5321 Procedure and treatment not carried out due to patient leaving prior to being seen by health care provider: Secondary | ICD-10-CM | POA: Insufficient documentation

## 2018-06-17 DIAGNOSIS — R5383 Other fatigue: Secondary | ICD-10-CM | POA: Diagnosis not present

## 2018-06-17 HISTORY — DX: Schizophrenia, unspecified: F20.9

## 2018-06-17 LAB — COMPREHENSIVE METABOLIC PANEL
ALT: 13 U/L (ref 0–44)
AST: 16 U/L (ref 15–41)
Albumin: 4.5 g/dL (ref 3.5–5.0)
Alkaline Phosphatase: 44 U/L (ref 38–126)
Anion gap: 13 (ref 5–15)
BUN: 16 mg/dL (ref 6–20)
CO2: 21 mmol/L — ABNORMAL LOW (ref 22–32)
Calcium: 9.5 mg/dL (ref 8.9–10.3)
Chloride: 103 mmol/L (ref 98–111)
Creatinine, Ser: 0.75 mg/dL (ref 0.44–1.00)
GFR calc Af Amer: >60 mL/min (ref >60)
GFR calc non Af Amer: >60 mL/min (ref >60)
Glucose, Bld: 132 mg/dL — ABNORMAL HIGH (ref 70–99)
Potassium: 3.3 mmol/L — ABNORMAL LOW (ref 3.5–5.1)
Sodium: 137 mmol/L (ref 135–145)
Total Bilirubin: 3.4 mg/dL — ABNORMAL HIGH (ref 0.3–1.2)
Total Protein: 7.1 g/dL (ref 6.5–8.1)

## 2018-06-17 LAB — CBC
HCT: 40.1 % (ref 36.0–46.0)
Hemoglobin: 14.4 g/dL (ref 12.0–15.0)
MCH: 33.2 pg (ref 26.0–34.0)
MCHC: 35.9 g/dL (ref 30.0–36.0)
MCV: 92.4 fL (ref 80.0–100.0)
Platelets: 206 10*3/uL (ref 150–400)
RBC: 4.34 MIL/uL (ref 3.87–5.11)
RDW: 12.2 % (ref 11.5–15.5)
WBC: 8.8 10*3/uL (ref 4.0–10.5)
nRBC: 0 % (ref 0.0–0.2)

## 2018-06-17 LAB — I-STAT BETA HCG BLOOD, ED (MC, WL, AP ONLY): I-stat hCG, quantitative: <5 m[IU]/mL (ref <5)

## 2018-06-17 LAB — ACETAMINOPHEN LEVEL: Acetaminophen (Tylenol), Serum: <10 ug/mL — ABNORMAL LOW (ref 10–30)

## 2018-06-17 LAB — SALICYLATE LEVEL: Salicylate Lvl: <7 mg/dL (ref 2.8–30.0)

## 2018-06-17 LAB — ETHANOL: Alcohol, Ethyl (B): <10 mg/dL (ref <10)

## 2018-06-17 NOTE — ED Notes (Signed)
No answer x3

## 2018-06-17 NOTE — ED Notes (Signed)
RN attempted to call pt back to room with no answer

## 2018-06-17 NOTE — ED Triage Notes (Addendum)
Pt arrived by [facility] from home. Pt states she "feels like she is going to die" bc of insomnia and fatigue. Denies SI or HI. Pt is mumbling to herself at triage but will not answer questions regarding hallucinations, denies etoh or drug use.

## 2018-07-01 ENCOUNTER — Telehealth: Payer: Self-pay | Admitting: *Deleted

## 2018-07-01 NOTE — Telephone Encounter (Signed)
TOC CM did follow up call. Pt states she did follow up with her PCP, Dr Dema Severin today. She just got out of [facility] and was there for 10 days. States she is waiting on lab results. Jonnie Finner RN CCM Case Mgmt phone [phone]

## 2018-09-19 ENCOUNTER — Emergency Department (HOSPITAL_BASED_OUTPATIENT_CLINIC_OR_DEPARTMENT_OTHER): Payer: Medicare Other

## 2018-09-19 ENCOUNTER — Other Ambulatory Visit: Payer: Self-pay

## 2018-09-19 ENCOUNTER — Encounter (HOSPITAL_BASED_OUTPATIENT_CLINIC_OR_DEPARTMENT_OTHER): Payer: Self-pay | Admitting: Emergency Medicine

## 2018-09-19 ENCOUNTER — Emergency Department (HOSPITAL_BASED_OUTPATIENT_CLINIC_OR_DEPARTMENT_OTHER)
Admission: EM | Admit: 2018-09-19 | Discharge: 2018-09-19 | Disposition: A | Discharge status: Home / Self Care | Payer: Medicare Other | Attending: Emergency Medicine | Admitting: Emergency Medicine

## 2018-09-19 DIAGNOSIS — R1010 Upper abdominal pain, unspecified: Secondary | ICD-10-CM | POA: Insufficient documentation

## 2018-09-19 DIAGNOSIS — R300 Dysuria: Secondary | ICD-10-CM | POA: Diagnosis present

## 2018-09-19 DIAGNOSIS — N390 Urinary tract infection, site not specified: Secondary | ICD-10-CM | POA: Insufficient documentation

## 2018-09-19 DIAGNOSIS — F17210 Nicotine dependence, cigarettes, uncomplicated: Secondary | ICD-10-CM | POA: Insufficient documentation

## 2018-09-19 LAB — URINALYSIS, ROUTINE W REFLEX MICROSCOPIC
Bilirubin Urine: NEGATIVE
Glucose, UA: NEGATIVE mg/dL
Ketones, ur: NEGATIVE mg/dL
Leukocytes,Ua: NEGATIVE
Nitrite: NEGATIVE
Protein, ur: NEGATIVE mg/dL
Specific Gravity, Urine: >1.03 — ABNORMAL HIGH (ref 1.005–1.030)
pH: 5.5 (ref 5.0–8.0)

## 2018-09-19 LAB — CBC WITH DIFFERENTIAL/PLATELET
Abs Immature Granulocytes: 0.01 10*3/uL (ref 0.00–0.07)
Basophils Absolute: 0 10*3/uL (ref 0.0–0.1)
Basophils Relative: 0 %
Eosinophils Absolute: 0 10*3/uL (ref 0.0–0.5)
Eosinophils Relative: 0 %
HCT: 40.7 % (ref 36.0–46.0)
Hemoglobin: 14.1 g/dL (ref 12.0–15.0)
Immature Granulocytes: 0 %
Lymphocytes Relative: 29 %
Lymphs Abs: 1.6 10*3/uL (ref 0.7–4.0)
MCH: 32.2 pg (ref 26.0–34.0)
MCHC: 34.6 g/dL (ref 30.0–36.0)
MCV: 92.9 fL (ref 80.0–100.0)
Monocytes Absolute: 0.2 10*3/uL (ref 0.1–1.0)
Monocytes Relative: 4 %
Neutro Abs: 3.6 10*3/uL (ref 1.7–7.7)
Neutrophils Relative %: 67 %
Platelets: 178 10*3/uL (ref 150–400)
RBC: 4.38 MIL/uL (ref 3.87–5.11)
RDW: 11.6 % (ref 11.5–15.5)
WBC: 5.5 10*3/uL (ref 4.0–10.5)
nRBC: 0 % (ref 0.0–0.2)

## 2018-09-19 LAB — COMPREHENSIVE METABOLIC PANEL
ALT: 12 U/L (ref 0–44)
AST: 14 U/L — ABNORMAL LOW (ref 15–41)
Albumin: 4 g/dL (ref 3.5–5.0)
Alkaline Phosphatase: 39 U/L (ref 38–126)
Anion gap: 10 (ref 5–15)
BUN: 20 mg/dL (ref 6–20)
CO2: 21 mmol/L — ABNORMAL LOW (ref 22–32)
Calcium: 9 mg/dL (ref 8.9–10.3)
Chloride: 108 mmol/L (ref 98–111)
Creatinine, Ser: 0.81 mg/dL (ref 0.44–1.00)
GFR calc Af Amer: >60 mL/min (ref >60)
GFR calc non Af Amer: >60 mL/min (ref >60)
Glucose, Bld: 156 mg/dL — ABNORMAL HIGH (ref 70–99)
Potassium: 3.3 mmol/L — ABNORMAL LOW (ref 3.5–5.1)
Sodium: 139 mmol/L (ref 135–145)
Total Bilirubin: 3 mg/dL — ABNORMAL HIGH (ref 0.3–1.2)
Total Protein: 6.7 g/dL (ref 6.5–8.1)

## 2018-09-19 LAB — LIPASE, BLOOD: Lipase: 30 U/L (ref 11–51)

## 2018-09-19 LAB — URINALYSIS, MICROSCOPIC (REFLEX)

## 2018-09-19 LAB — PREGNANCY, URINE: Preg Test, Ur: NEGATIVE

## 2018-09-19 MED ORDER — KETOROLAC TROMETHAMINE 15 MG/ML IJ SOLN
15.0000 mg | Freq: Once | INTRAMUSCULAR | Status: AC
  Administered 2018-09-19: 15 mg via INTRAVENOUS
  Filled 2018-09-19: qty 1

## 2018-09-19 MED ORDER — NITROFURANTOIN MONOHYD MACRO 100 MG PO CAPS: 100.0000 mg | ORAL_CAPSULE | Freq: Two times a day (BID) | ORAL | 0 refills | Status: DC

## 2018-09-19 MED ORDER — PHENAZOPYRIDINE HCL 200 MG PO TABS: 200.0000 mg | ORAL_TABLET | Freq: Three times a day (TID) | ORAL | 0 refills | Status: DC

## 2018-09-19 MED ORDER — NITROFURANTOIN MONOHYD MACRO 100 MG PO CAPS
100.0000 mg | ORAL_CAPSULE | Freq: Once | ORAL | Status: AC
  Administered 2018-09-19: 20:00:00 100 mg via ORAL
  Filled 2018-09-19: qty 1
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
## 2018-09-19 NOTE — ED Notes (Signed)
Patient transported to CT 

## 2018-09-19 NOTE — ED Triage Notes (Signed)
Low abd pain for several days. States she has been incontinent during the night. Endorses dysuria.

## 2018-09-20 LAB — URINE CULTURE: Culture: <10000 — AB

## 2019-04-10 ENCOUNTER — Emergency Department (HOSPITAL_BASED_OUTPATIENT_CLINIC_OR_DEPARTMENT_OTHER)
Admission: EM | Admit: 2019-04-10 | Discharge: 2019-04-10 | Disposition: A | Discharge status: Home / Self Care | Payer: Medicare Other | Attending: Emergency Medicine | Admitting: Emergency Medicine

## 2019-04-10 ENCOUNTER — Emergency Department (HOSPITAL_BASED_OUTPATIENT_CLINIC_OR_DEPARTMENT_OTHER): Payer: Medicare Other

## 2019-04-10 ENCOUNTER — Other Ambulatory Visit: Payer: Self-pay

## 2019-04-10 ENCOUNTER — Encounter (HOSPITAL_BASED_OUTPATIENT_CLINIC_OR_DEPARTMENT_OTHER): Payer: Self-pay | Admitting: Emergency Medicine

## 2019-04-10 DIAGNOSIS — R0789 Other chest pain: Secondary | ICD-10-CM | POA: Diagnosis not present

## 2019-04-10 DIAGNOSIS — F17210 Nicotine dependence, cigarettes, uncomplicated: Secondary | ICD-10-CM | POA: Diagnosis not present

## 2019-04-10 DIAGNOSIS — R0602 Shortness of breath: Secondary | ICD-10-CM | POA: Diagnosis present

## 2019-04-10 DIAGNOSIS — R0609 Other forms of dyspnea: Secondary | ICD-10-CM | POA: Diagnosis not present

## 2019-04-10 DIAGNOSIS — Z886 Allergy status to analgesic agent status: Secondary | ICD-10-CM | POA: Insufficient documentation

## 2019-04-10 DIAGNOSIS — Z79899 Other long term (current) drug therapy: Secondary | ICD-10-CM | POA: Diagnosis not present

## 2019-04-10 DIAGNOSIS — Z888 Allergy status to other drugs, medicaments and biological substances status: Secondary | ICD-10-CM | POA: Diagnosis not present

## 2019-04-10 DIAGNOSIS — Z20822 Contact with and (suspected) exposure to covid-19: Secondary | ICD-10-CM | POA: Diagnosis not present

## 2019-04-10 DIAGNOSIS — R0600 Dyspnea, unspecified: Secondary | ICD-10-CM

## 2019-04-10 LAB — BASIC METABOLIC PANEL
Anion gap: 9 (ref 5–15)
BUN: 20 mg/dL (ref 6–20)
CO2: 20 mmol/L — ABNORMAL LOW (ref 22–32)
Calcium: 8.3 mg/dL — ABNORMAL LOW (ref 8.9–10.3)
Chloride: 103 mmol/L (ref 98–111)
Creatinine, Ser: 0.68 mg/dL (ref 0.44–1.00)
GFR calc Af Amer: >60 mL/min (ref >60)
GFR calc non Af Amer: >60 mL/min (ref >60)
Glucose, Bld: 107 mg/dL — ABNORMAL HIGH (ref 70–99)
Potassium: 3.4 mmol/L — ABNORMAL LOW (ref 3.5–5.1)
Sodium: 132 mmol/L — ABNORMAL LOW (ref 135–145)

## 2019-04-10 LAB — CBC
HCT: 35.1 % — ABNORMAL LOW (ref 36.0–46.0)
Hemoglobin: 11.5 g/dL — ABNORMAL LOW (ref 12.0–15.0)
MCH: 34.3 pg — ABNORMAL HIGH (ref 26.0–34.0)
MCHC: 32.8 g/dL (ref 30.0–36.0)
MCV: 104.8 fL — ABNORMAL HIGH (ref 80.0–100.0)
Platelets: 364 10*3/uL (ref 150–400)
RBC: 3.35 MIL/uL — ABNORMAL LOW (ref 3.87–5.11)
RDW: 14.3 % (ref 11.5–15.5)
WBC: 6.4 10*3/uL (ref 4.0–10.5)
nRBC: 0 % (ref 0.0–0.2)

## 2019-04-10 LAB — SARS CORONAVIRUS 2 (TAT 6-24 HRS): SARS Coronavirus 2: NEGATIVE

## 2019-04-10 LAB — SARS CORONAVIRUS 2 AG (30 MIN TAT): SARS Coronavirus 2 Ag: NEGATIVE

## 2019-04-10 LAB — D-DIMER, QUANTITATIVE: D-Dimer, Quant: 1.11 ug/mL-FEU — ABNORMAL HIGH (ref 0.00–0.50)

## 2019-04-10 MED ORDER — IOHEXOL 350 MG/ML SOLN
100.0000 mL | Freq: Once | INTRAVENOUS | Status: AC | PRN
  Administered 2019-04-10: 100 mL via INTRAVENOUS

## 2019-04-10 NOTE — ED Notes (Signed)
Pt wants covid test states has court tomorrow and doesn't want them thinking she has covid if she is gasping for breath

## 2019-04-10 NOTE — Discharge Instructions (Addendum)
CT scan did not show any evidence of blood clot.  There is also no signs of pneumonia.  Your evaluation today is reassuring.  Rapid Covid test was negative.  The more sensitive Covid test will be back within 24 hours. Follow up with your doctor for further evaluation
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
## 2019-04-10 NOTE — ED Triage Notes (Signed)
States has been having increasing sob and  Pain on left side when she  Lays on it since the 3/19 after taking an anti inflammatory that day

## 2019-05-01 ENCOUNTER — Emergency Department (HOSPITAL_BASED_OUTPATIENT_CLINIC_OR_DEPARTMENT_OTHER)
Admission: EM | Admit: 2019-05-01 | Discharge: 2019-05-01 | Disposition: A | Discharge status: Home / Self Care | Payer: Medicare Other | Attending: Emergency Medicine | Admitting: Emergency Medicine

## 2019-05-01 ENCOUNTER — Other Ambulatory Visit: Payer: Self-pay

## 2019-05-01 ENCOUNTER — Emergency Department (HOSPITAL_BASED_OUTPATIENT_CLINIC_OR_DEPARTMENT_OTHER): Payer: Medicare Other

## 2019-05-01 ENCOUNTER — Encounter (HOSPITAL_BASED_OUTPATIENT_CLINIC_OR_DEPARTMENT_OTHER): Payer: Self-pay

## 2019-05-01 DIAGNOSIS — Z79899 Other long term (current) drug therapy: Secondary | ICD-10-CM | POA: Diagnosis not present

## 2019-05-01 DIAGNOSIS — Z87891 Personal history of nicotine dependence: Secondary | ICD-10-CM | POA: Insufficient documentation

## 2019-05-01 DIAGNOSIS — R109 Unspecified abdominal pain: Secondary | ICD-10-CM | POA: Diagnosis not present

## 2019-05-01 LAB — COMPREHENSIVE METABOLIC PANEL
ALT: 34 U/L (ref 0–44)
AST: 24 U/L (ref 15–41)
Albumin: 3.9 g/dL (ref 3.5–5.0)
Alkaline Phosphatase: 57 U/L (ref 38–126)
Anion gap: 7 (ref 5–15)
BUN: 10 mg/dL (ref 6–20)
CO2: 22 mmol/L (ref 22–32)
Calcium: 8.6 mg/dL — ABNORMAL LOW (ref 8.9–10.3)
Chloride: 108 mmol/L (ref 98–111)
Creatinine, Ser: 0.55 mg/dL (ref 0.44–1.00)
GFR calc Af Amer: >60 mL/min (ref >60)
GFR calc non Af Amer: >60 mL/min (ref >60)
Glucose, Bld: 98 mg/dL (ref 70–99)
Potassium: 3.8 mmol/L (ref 3.5–5.1)
Sodium: 137 mmol/L (ref 135–145)
Total Bilirubin: 1.3 mg/dL — ABNORMAL HIGH (ref 0.3–1.2)
Total Protein: 6.7 g/dL (ref 6.5–8.1)

## 2019-05-01 LAB — CBC WITH DIFFERENTIAL/PLATELET
Abs Immature Granulocytes: 0.01 10*3/uL (ref 0.00–0.07)
Basophils Absolute: 0 10*3/uL (ref 0.0–0.1)
Basophils Relative: 1 %
Eosinophils Absolute: 0 10*3/uL (ref 0.0–0.5)
Eosinophils Relative: 0 %
HCT: 41 % (ref 36.0–46.0)
Hemoglobin: 13.8 g/dL (ref 12.0–15.0)
Immature Granulocytes: 0 %
Lymphocytes Relative: 18 %
Lymphs Abs: 1.1 10*3/uL (ref 0.7–4.0)
MCH: 34.2 pg — ABNORMAL HIGH (ref 26.0–34.0)
MCHC: 33.7 g/dL (ref 30.0–36.0)
MCV: 101.7 fL — ABNORMAL HIGH (ref 80.0–100.0)
Monocytes Absolute: 0.4 10*3/uL (ref 0.1–1.0)
Monocytes Relative: 6 %
Neutro Abs: 4.5 10*3/uL (ref 1.7–7.7)
Neutrophils Relative %: 75 %
Platelets: 201 10*3/uL (ref 150–400)
RBC: 4.03 MIL/uL (ref 3.87–5.11)
RDW: 12.1 % (ref 11.5–15.5)
WBC: 6.1 10*3/uL (ref 4.0–10.5)
nRBC: 0 % (ref 0.0–0.2)

## 2019-05-01 LAB — LIPASE, BLOOD: Lipase: 36 U/L (ref 11–51)

## 2019-05-01 LAB — URINALYSIS, ROUTINE W REFLEX MICROSCOPIC
Bilirubin Urine: NEGATIVE
Glucose, UA: NEGATIVE mg/dL
Ketones, ur: NEGATIVE mg/dL
Leukocytes,Ua: NEGATIVE
Nitrite: NEGATIVE
Protein, ur: NEGATIVE mg/dL
Specific Gravity, Urine: 1.01 (ref 1.005–1.030)
pH: 7 (ref 5.0–8.0)

## 2019-05-01 LAB — URINALYSIS, MICROSCOPIC (REFLEX)

## 2019-05-01 LAB — PREGNANCY, URINE: Preg Test, Ur: NEGATIVE

## 2019-05-01 MED ORDER — IOHEXOL 300 MG/ML  SOLN
100.0000 mL | Freq: Once | INTRAMUSCULAR | Status: AC | PRN
  Administered 2019-05-01: 100 mL via INTRAVENOUS

## 2019-05-01 NOTE — ED Triage Notes (Signed)
Pt states thinks she has a hernia, pain lower right, states visibly larger. Happened a week ago, worse last 2 days.  Denies urinary difficulty, denies change in stool pattern.  LBM yesterday.  No visible blood. Denies n/v, denies fever
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
## 2020-05-06 NOTE — Progress Notes (Signed)
 Medicare Subsequent AWV   Amanda Chang is a 35 y.o. female who presents for her subsequent annual wellness visit for Medicare.  Any physical exam components or additional concerns beyond the scope of the Annual Wellness Visit may be documented in a separate note within this encounter.  Health Risk Assessment   Current Living Arrangement: Alone During the past four weeks, how much pain in your body have you had on a scale of 0-10?: No pain (0) During the past four weeks, was someone available to help you if you needed and wanted help?: Yes, as much as I wanted During the past four weeks, what was the hardest level of physical activity you could do for at least two minutes?: Moderate Each night, how many hours of sleep do you usually get?: 8 Do you snore or has anyone told you that you snore?: (!) Yes Do you always fasten your seat belt when you are in a car?: Yes Do you drive after drinking alcohol or ride with a driver who has been drinking?: No How often during the past four weeks have you been bothered by sexual problems?: Never Do any of the following describe you?  Multiple sex partners and/or intercourse with partner of the same sex: No How often during the past four weeks have you been bothered by teeth or denture problems?: Never How often during the past four weeks have you been bothered by tiredness or fatigue?: Never During the past four weeks, how would you rate your health in general?: Good What is your race? (Check all that apply): White   Substance Use Disorder and Opioid Risk Screening   Any identifiable risks for substance use disorder (illegal or inappropriate use of a controlled substance)?  Please select any that are applicable.: No identifiable risks Is patient currently using an opioid agonist medication routinely or PRN?: No   Depression Screening   Depression Screen 05/06/2020 06/25/2019 11/15/2017  Please choose the category that best  describes the patient's current state 0 0 1  Not eligible on the basis of Not applicable Not applicable Not applicable  1. Little interest or pleasure in doing things 0 0 0  2. Feeling down, depressed, or hopeless 0 0 0  PHQ-2 Total Score 0 0 0  PHQ-2 Interpretation of Score for Depression (Payor) Negative Negative -  Some recent data might be hidden     Cognitive and Functional Assessments   Is the person deaf or does he/she have serious difficulty hearing?: No Is this person blind or does he/she have serious difficulty seeing even when wearing glasses?: No Do you/patient have serious difficulty concentrating, remembering, or making decisions?: No Have you had any concerns about changes in your memory or concentration?: No  ADL Assessment   Please select any of the following that the person has serious difficulty managing on their own:: none apply Please select any of the following that the person has serious difficulty managing on their own:: none apply   Social Determinants of Health (SDoH) and Personal Data   On average, how many days per week do you engage in moderate to strenuous exercise (like a brisk walk)?: 2 days (walking) On average, how many minutes do you engage in exercise at this level?: 40 min How hard is it for you to pay for the very basics like food, housing, medical care, and heating?: Not hard at all In the last 12 months, was there a time when you were not able  to pay the mortgage or rent on time?: No In the last 12 months, how many places have you lived?: 1 In the last 12 months, was there a time when you did not have a steady place to sleep or slept in a shelter (including now)?: No In the past 12 months, has lack of transportation kept you from medical appointments or from getting medications?: No In the past 12 months, has lack of transportation kept you from meetings, work, or from getting things needed for daily living?: No Within the past 12 months, you  worried that your food would run out before you got the money to buy more.: Never true Within the past 12 months, the food you bought just didn't last and you didn't have money to get more.: Never true Do you feel stress - tense, restless, nervous, or anxious, or unable to sleep at night because your mind is troubled all the time - these days?: Only a little In a typical week, how many times do you talk on the phone with family, friends, or neighbors?: More than three times a week How often do you get together with friends or relatives?: Once a week How often do you attend church or religious services?: Never Do you belong to any clubs or organizations such as church groups, unions, fraternal or athletic groups, or school groups?: No How often do you attend meetings of the clubs or organizations you belong to?: Never Are you married, widowed, divorced, separated, never married, or living with a partner?: Never married Q1: How often do you have a drink containing alcohol?: Monthly or less Q2: How many drinks containing alcohol do you have on a typical day when you are drinking?: 1 or 2 Q3: How often do you have six or more drinks on one occasion?: Never Do you depend on people living with you for personal care?: No Are there any cultural or religious beliefs that your healthcare provider should be aware of that would be helpful in your health care? : No  Advance Care Directives   Do you have a living will?: (!) No Do you have a Midwife?: (!) No  Falls Risk Screening      Patient can ambulate: Yes Do you feel unsteady when standing or walking?: No Do you worry about falling?: No Have you fallen in the past year?: No  Medicare Required Components     Reviewed and updated this visit by provider: Tobacco  Allergies  Meds  Problems  Med Hx  Surg Hx  Fam Hx         Cognitive screen indicated?: No Based on my direct observation, with due consideration of  information obtained via beneficiary reports and communication with family members/care takers, further cognitive assessment is not indicated.  Dietary issues addressed:: Yes HRA completed and reviewed:: Yes Care Team updated:: Yes Advance care directives discussed and information provided if necessary:: Yes  Patient Care Team: Aldona LITTIE Pizza, NP as PCP - General (Family Medicine) Alm Dames, MD as Consulting Physician (Gastroenterology)  Vitals    Vitals:   05/06/20 0932  BP: 128/81  Patient Position: Sitting  Pulse: 91  Temp: 96.8 F (36 C)  TempSrc: Temporal  Resp: 16  Height: 5' 2 (1.575 m)  Weight: 139 lb (63 kg)  SpO2: 98%  BMI (Calculated): 25.4  PainSc: 0-No pain    Disposition   1. Medicare annual wellness visit, subsequent (Primary)    Follow up if symptoms worsen or  fail to improve.   Health maintenance issues including appropriate cancer screening, annual eye exam, healthy diet, exercise and tobacco avoidance were discussed with the patient.  A written plan was provided to the patient in the form of patient instructions in the after visit summary.

## 2020-05-06 NOTE — Progress Notes (Signed)
 Clinical documentation reviewed. I agree with the assessment and plan.

## 2020-10-13 IMAGING — CT CT ABD-PELV W/ CM
2 of 5 series · 16 of 46 positions shown, 18 images · IV contrast (Omnipaque)
Comparison: 09/19/2018

CLINICAL DATA: Abdominal abscess suspected. Right lower quadrant
pain. Possible hernia.

EXAM:
CT ABDOMEN AND PELVIS WITH CONTRAST
TECHNIQUE: Multidetector CT imaging of the abdomen and pelvis was performed
using the standard protocol following bolus administration of
intravenous contrast.
CONTRAST:  100mL OMNIPAQUE IOHEXOL 300 MG/ML  SOLN

[Series 2: axial st · axial · 0.76mm/px · z∈[-465,-45]mm · 13 of 94 slices shown, 15 images]
[im 5/94  soft-tissue]
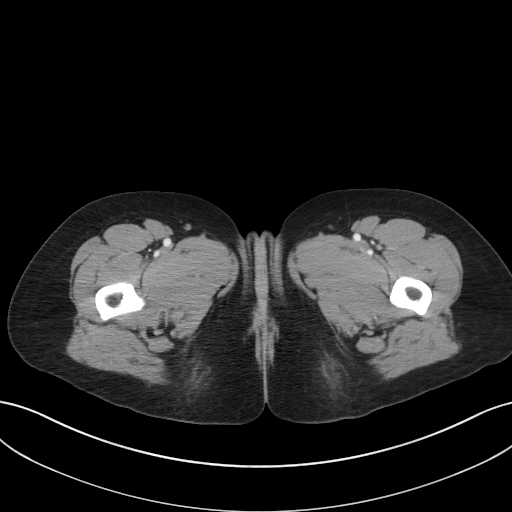
[im 5/94  bone]
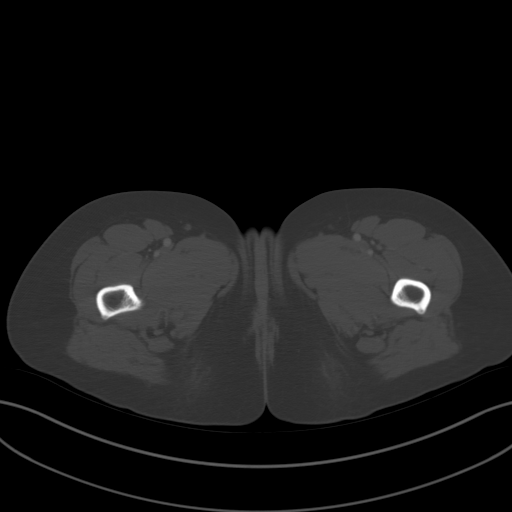
[im 15/94  soft-tissue]
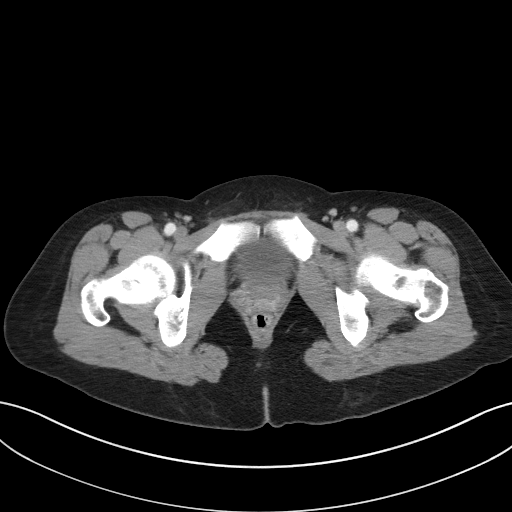
[im 20/94  soft-tissue]
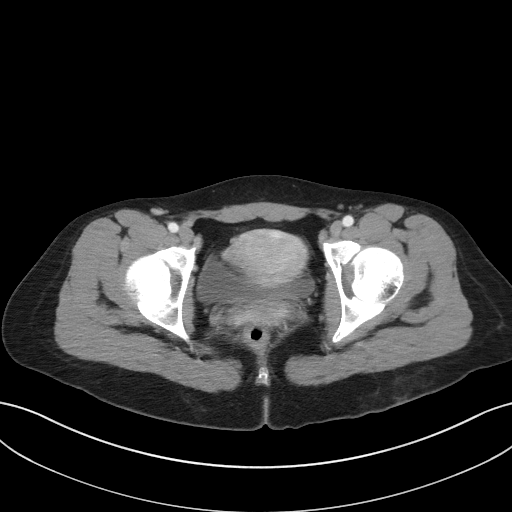
[im 25/94  soft-tissue]
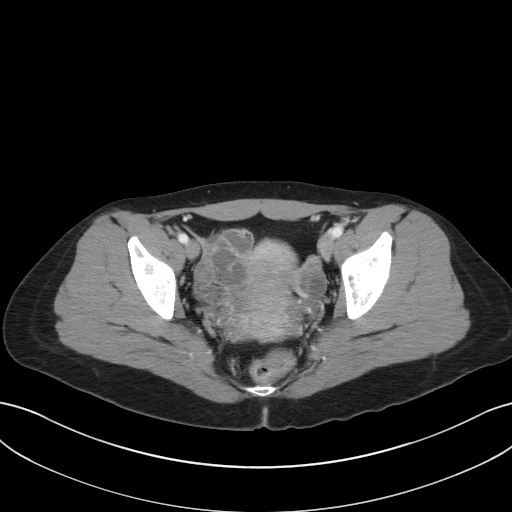
[im 35/94  soft-tissue]
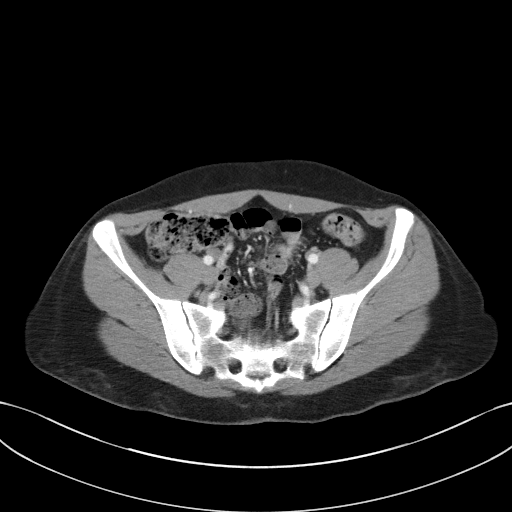
[im 40/94  soft-tissue]
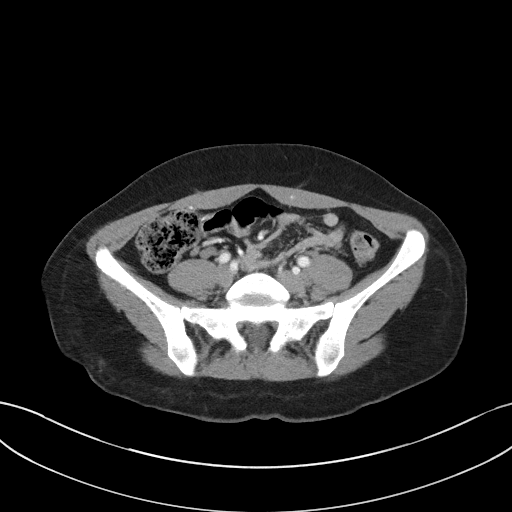
[im 49/94  soft-tissue]
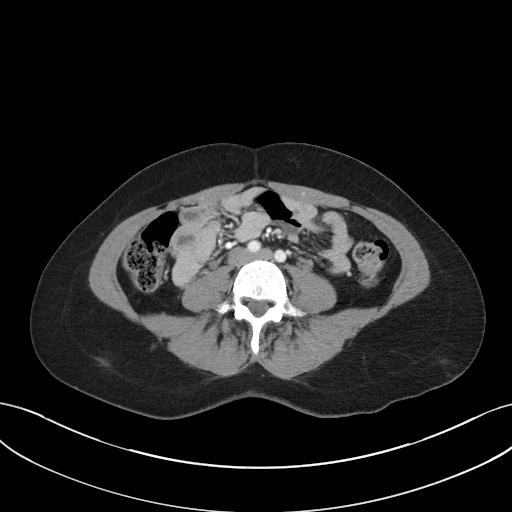
[im 54/94  soft-tissue]
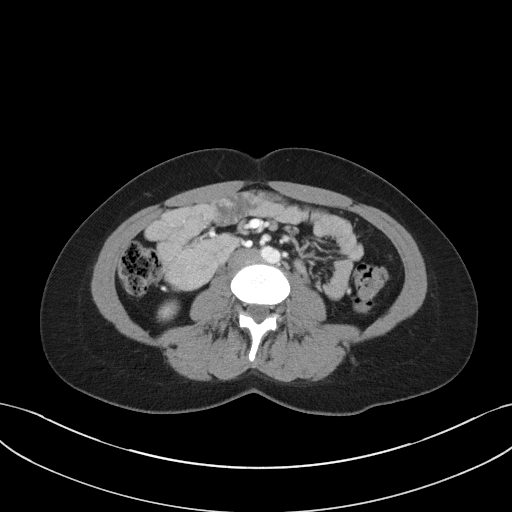
[im 59/94  soft-tissue]
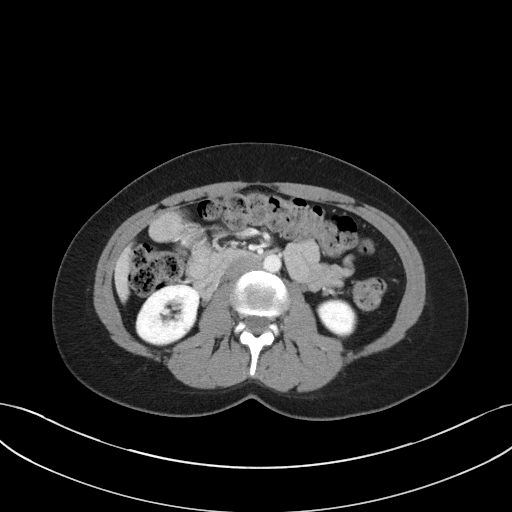
[im 59/94  bone]
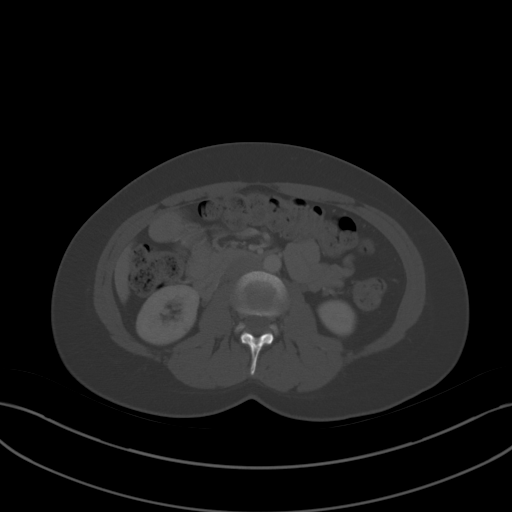
[im 69/94  soft-tissue]
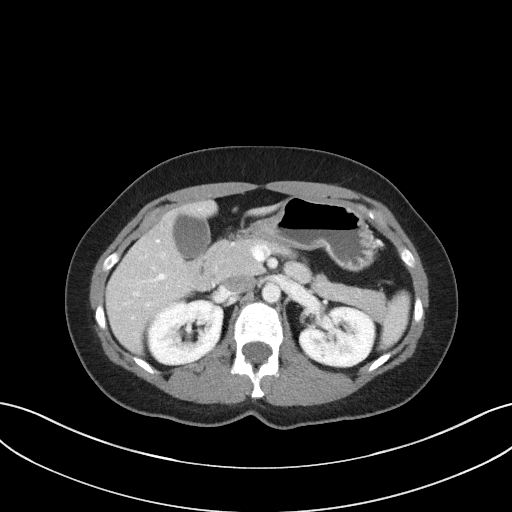
[im 74/94  soft-tissue]
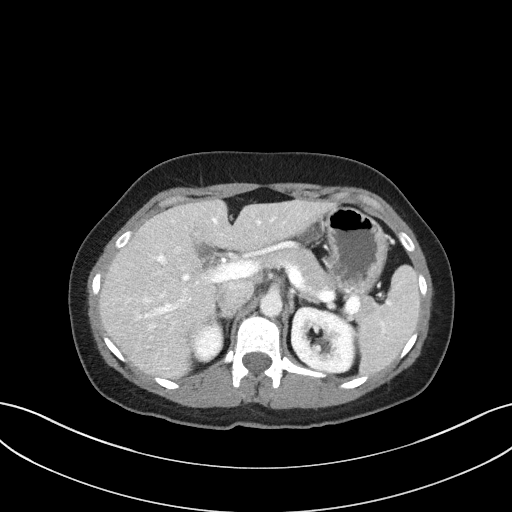
[im 79/94  soft-tissue]
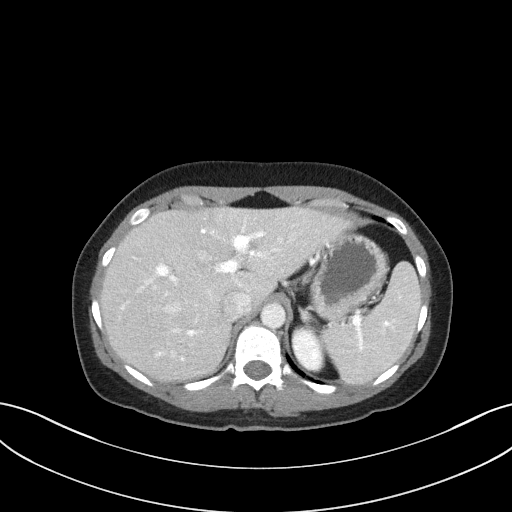
[im 89/94  soft-tissue]
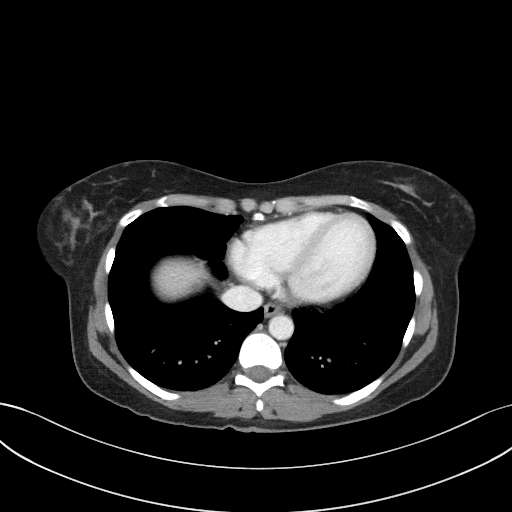

[Series 5: coronal st · coronal · 0.70mm/px · 3 of 74 slices shown]
[im 25/74  soft-tissue]
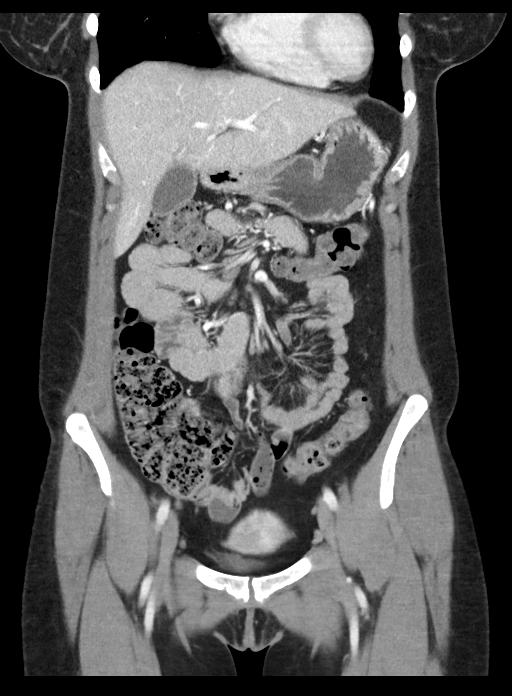
[im 33/74  soft-tissue]
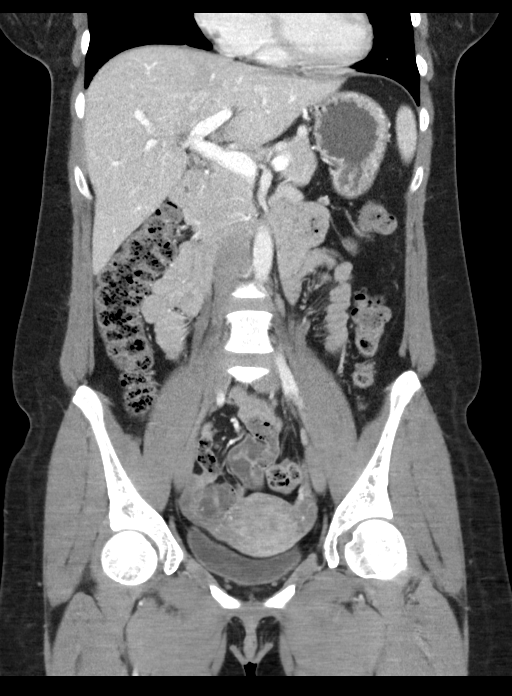
[im 41/74  soft-tissue]
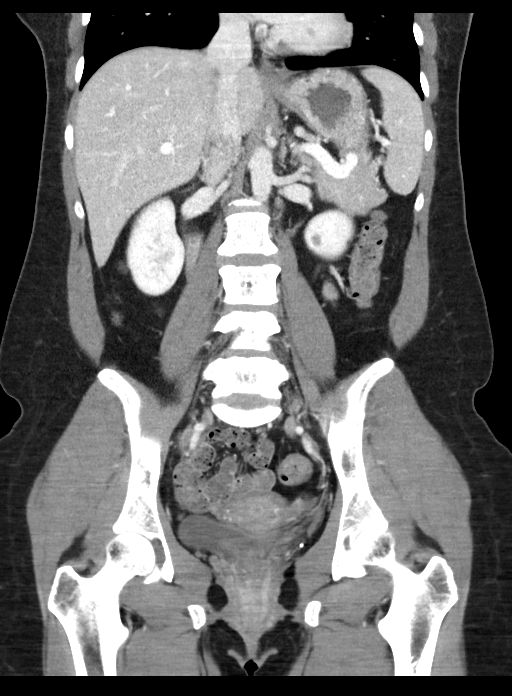

[16 of 46 positions shown; findings below may reference images not displayed]

FINDINGS: Lower chest:  No contributory findings.

Hepatobiliary: No focal liver abnormality.No evidence of biliary
obstruction or stone.

Pancreas: Unremarkable.

Spleen: Unremarkable.

Adrenals/Urinary Tract: Negative adrenals. No hydronephrosis or
ureteral stone. Small bilateral renal cysts. Punctate bilateral
upper pole calculi, see coronal reformats. There is also a small
interpolar right renal calculus. Unremarkable bladder.

Stomach/Bowel:  No obstruction. No appendicitis.

Vascular/Lymphatic: No acute vascular abnormality. No mass or
adenopathy.

Reproductive:No pathologic findings.

Other: No ascites or pneumoperitoneum.  No noted hernia.

Musculoskeletal: No acute abnormalities.
IMPRESSION: 1. No acute finding, including appendicitis.
2. Tiny bilateral renal calculi.

## 2021-03-23 NOTE — Progress Notes (Signed)
 [facility] New Patient Evaluation   HPI: Amanda Chang is a 36 y.o. female with PMH as below referred for headaches. History from patient, EMR.  Patient actually describes 2 different types of headaches.  She says that for the past several months, she has had a migraine that she describes as almost feeling like someone lodged bullet in the middle of her head.  This headache is associated with light sensitivity and nausea.  It was going on for several months straight but has actually subsided in the last several weeks.  When it was going on, she would take Zofran  which actually seems to help her more than as needed Imitrex would.  She also describes a shooting/stabbing type of pain in her head that goes from the front of the head to the back.  No migrainous features associated with that.  She says the frequency is about 2-3 times per week.  The duration when it does occur is very short lasting, just a few seconds or so.  She denies any positional components to her headaches although does say that laying down can help whenever it does occur.  No weakness, numbness, visual changes.  She does stay well-hydrated.  Does not skip meals.  She does sleep well at night.  She does not drink caffeine.   Current HA medications: -sumatriptan (imitrex) prn -zofran  prn for nausea   Prior HA medications:  -n/a   Prior workup: MRI brain: n/a CT head wo (06/2015): normal LP:     Past, family, social histories reviewed in the medical record with details as outlined further below:  Past Medical History:  Diagnosis Date  . Anxiety   . Colon polyp   . Depression   . Disease of thyroid gland   . Eczema   . Histrionic personality disorder (*)   . 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 Hypertension   . Renal disorder   . Substance abuse (*)      Past Surgical History:  Procedure Laterality Date  . Breast surgery     reduction  . Colonoscopy  10/2018   Dr Lucila - SSA removed,  random bx negative  . Lithotripsy    . Upper gastrointestinal endoscopy  2012    Allergies  Allergen Reactions  . Tylenol  [Acetaminophen ] Itching  . Belladonna Alkaloids Unknown  . Cinnamon Other    Sore throat     Current Outpatient Medications on File Prior to Visit  Medication Sig Dispense Refill  . fluconazole  (DIFLUCAN ) 200 mg tablet Take one tablet (200 mg dose) by mouth.    SABRA ketoconazole (NIZORAL) 2 % cream Apply one application. topically 2 (two) times daily.    SABRA ketoconazole (NIZORAL) 2% shampoo Apply topically 2 (two) times a week.    . levocetirizine (XYZAL) 5 MG tablet Take one tablet (5 mg dose) by mouth every evening. (Patient taking differently: Take one half tablet (2.5 mg dose) by mouth every evening.) 30 tablet 11  . medroxyPROGESTERone  (DEPO-PROVERA ) 150 mg/mL injection INJECT 1ML (150MG  DOSE) INTO THE MUSCLE EVERY 3 MONTHS 1 mL 0  . omeprazole (PRILOSEC) 40 mg capsule Take one capsule (40 mg dose) by mouth daily for 30 days. 30 capsule 2  . ondansetron  (ZOFRAN -ODT) 4 mg disintegrating tablet TAKE ONE TABLET BY MOUTH EVERY 8 HOURS AS NEEDED FOR NAUSEA FOR UP TO 15 DOSES. 15 tablet 0  . [DISCONTINUED] potassium chloride  (POTASSIUM CHLORIDE ) 10 mEq CR tablet Take one tablet (10 mEq dose) by mouth daily. (  Patient not taking: Reported on 12/25/2019) 30 tablet 5  . SUMAtriptan succinate (IMITREX) 100 mg tablet Take one tablet (100 mg dose) by mouth once as needed for Migraine for up to 1 dose. May repeat dose in 2 hours. Maximum dose 200 mg/24 hours. 30 tablet 2   No current facility-administered medications on file prior to visit.    Family History  Problem Relation Age of Onset  . Anxiety disorder Mother   . Colon polyps Mother        101s, precancerous polyps  . Diabetes Father   . Alcohol abuse Brother   . Drug abuse Brother   . Arthritis Maternal Grandmother   . Cancer Maternal Grandmother        lung  . COPD Maternal Grandmother   . Cancer Paternal  Grandmother        breast  . Colon cancer Neg Hx   . Inflammatory bowel disease Neg Hx   . Stomach cancer Neg Hx   . Rectal cancer Neg Hx   . Pancreatic cancer Neg Hx   . Ovarian cancer Neg Hx   . Endometrial cancer Neg Hx     Social History   Socioeconomic History  . Marital status: Single    Spouse name: None  . Number of children: None  . Years of education: None  . Highest education level: None  Occupational History  . Occupation: Consulting civil engineer  Tobacco Use  . Smoking status: Former    Packs/day: 0.50    Years: 10.00    Pack years: 5.00    Types: Cigarettes    Quit date: 06/17/2018    Years since quitting: 2.7  . Smokeless tobacco: Never  Vaping Use  . Vaping Use: Former  Substance and Sexual Activity  . Alcohol use: Not Currently    Comment: occasionally, few times per year  . Drug use: No  . Sexual activity: Not Currently  Other Topics Concern  . None  Social History Narrative  . None   Social Determinants of Health   Financial Resource Strain: Low Risk   . Difficulty of Paying Living Expenses: Not hard at all  Food Insecurity: No Food Insecurity  . Worried About Programme researcher, broadcasting/film/video in the Last Year: Never true  . Ran Out of Food in the Last Year: Never true  Transportation Needs: No Transportation Needs  . Lack of Transportation (Medical): No  . Lack of Transportation (Non-Medical): No  Physical Activity: Insufficiently Active  . Days of Exercise per Week: 3 days  . Minutes of Exercise per Session: 30 min  Stress: No Stress Concern Present  . Feeling of Stress : Not at all  Social Connections: Unknown  . Frequency of Communication with Friends and Family: Three times a week  . Frequency of Social Gatherings with Friends and Family: Twice a week  . Attends Religious Services: Never  . Active Member of Clubs or Organizations: No  . Attends Banker Meetings: Never  . Marital Status: Patient refused  Intimate Partner Violence: Not At Risk  .  Fear of Current or Ex-Partner: No  . Emotionally Abused: No  . Physically Abused: No  . Sexually Abused: No  Housing Stability: Low Risk   . Unable to Pay for Housing in the Last Year: No  . Number of Places Lived in the Last Year: 1  . Unstable Housing in the Last Year: No       REVIEW OF SYSTEMS: All systems reviewed and negative  except as stated above.   PHYSICAL EXAM:  BP 124/90 (BP Location: Right arm, Patient Position: Sitting)   Pulse 99   Temp 98.4 F (36.9 C) (Temporal)   Wt 153 lb 3.2 oz (69.5 kg)   SpO2 97%   BMI 28.02 kg/m   GENERAL: No acute distress. ENT: Throat: oropharynx clear.  CARDIOVASCULAR: Palpation/Auscultation: regular rate and rhythm.  RESPIRATORY: clear to auscultation bilaterally.  GASTROINTESTINAL: Abdomen: benign, bowel sounds present.  SKIN: Inspection: well perfused, no edema.   MENTAL STATUS EXAM: Orientation: Alert and oriented to person, place and time. Memory: Cooperative, follows commands well. Recent and remote memory normal. Attention, concentration: Attention span and concentration are normal. Language: Speech is clear and language is normal. Fund of knowledge: Aware of current events, vocabulary appropriate for patient age.   CRANIAL NERVES: CN 2 (Optic): Visual fields intact to confrontation CN 3,4,6 (EOM): Pupils equal and reactive to light. Full extraocular eye movement without nystagmus. CN 5 (Trigeminal): Facial sensation is normal, no weakness of masticatory muscles. CN 7 (Facial): No facial weakness or asymmetry. CN 8 (Auditory): Auditory acuity grossly normal. CN 9,10 (Glossophar): The uvula is midline, the palate elevates symmetrically. CN 11 (spinal access): Normal sternocleidomastoid and trapezius strength. CN 12 (Hypoglossal): The tongue is midline. No atrophy or fasciculations.   MOTOR: Muscle Strength:  RUE: 5/5 RLE: 5/5 LUE: 5/5 LLE: 5/5  Muscle Tone: Tone and muscle bulk are normal in the upper and lower  extremities.   REFLEXES:  Reflex R L  Biceps +2 +2  Brachioradialis +2 +2  Triceps +2 +2  Patellar +2 +2  Achilles +2 +2  Babinski    Hoffman's      COORDINATION: Intact finger-to-nose b/l without ataxia. No rest tremor.  SENSATION: Intact to light touch x 4  GAIT: Routine gait normal   DIAGNOSTIC STUDIES:   -as above  LABORATORY STUDIES:   -as above   ASSESSMENT:  36 y.o. female with PMH as above referred for headaches.  Patient describes 2 different types of headaches.  Describes migraines and separately describes a shooting type of pain in her head.  The migraine has subsided of late.  Does still get a shooting pain in her head but it is very brief, only lasting a few seconds or so.  As these are new onset headaches and patient has not had any recent head imaging, I do think that we should do an MRI of the brain.  I offered the patient a daily prophylactic medicine but she would prefer to avoid taking a daily headache medicine which is reasonable.  The shooting pain types of headaches are so brief in duration that I really do not think that she would benefit from an as needed medicine for that.  Therefore, we will do an MRI of the brain to rule out any worrisome pathology.  If that is negative, then we will do as needed follow-up.  Patient is amenable with this plan.   1. New onset of headaches     PLAN:  Orders Placed This Encounter  Procedures  . MRI Head WO IV Contrast    -labs/diagnostics tests as ordered above (if any) -MRI brain -she defers a daily prophylactic headache medicine at this time -follow up prn for worsening headaches   All new prescription medications and changes in prescription dosages were discussed with the patient, including patient education, medication name, use, and dosage, potential side effects, drug interactions, consequences of not using/taking and special instructions. The patient expressed  understanding. No barriers to adherence are  present. The aforementioned diagnosis, management plans, and prognosis were extensively reviewed with the patient who voiced understanding and agreed. This note was dictated with voice recognition software. Inadvertently, similar sounding words can sometimes get transcribed incorrectly.

## 2021-05-06 NOTE — Progress Notes (Signed)
 Subjective:   Amanda Chang is a 36 y.o. female here for evaluation of  Weight gain.   Seeing skin surgery center and had some labs recently.   Has been having weight gain. Heart md told her cholesterol was high. Was told to change her diet. She no longer drinks soda. Eats chicken salmon vegetable. Feels like she is gaining a pound a day. Only way to lose weight is to starve. She has tried Golo with no weight loss. Has tried green tea extract, kao natural product. If she eats 2 meals per day she will gain weight. She is on Depo for birth control. She is walking and swimming for exercise.  Dr. Luego was treating her for ADHD on adderall. He is no longer practicing and she has been off her ADHD meds. She would like to get back on them to see if that would help with weight loss. She has Random drug screens with [facility]. She has passed these tests. She is in school for Psychology and criminal justice.  She has been diagnosed with PTSD. Also some phobia.    Review of Systems - All other systems were reviewed and are negative unless stated in HPI.  Family History  Problem Relation Age of Onset  . Anxiety disorder Mother   . Colon polyps Mother        67s, precancerous polyps  . Diabetes Father   . Alcohol abuse Brother   . Drug abuse Brother   . Arthritis Maternal Grandmother   . Cancer Maternal Grandmother        lung  . COPD Maternal Grandmother   . Cancer Paternal Grandmother        breast  . Colon cancer Neg Hx   . Inflammatory bowel disease Neg Hx   . Stomach cancer Neg Hx   . Rectal cancer Neg Hx   . Pancreatic cancer Neg Hx   . Ovarian cancer Neg Hx   . Endometrial cancer Neg Hx    Past Medical History:  Diagnosis Date  . Anxiety   . Colon polyp   . Depression   . Disease of thyroid gland   . Eczema   . Histrionic personality disorder (*)   . 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 Hypertension   . Renal disorder   . Substance abuse (*)    Past Surgical History:   Procedure Laterality Date  . Breast surgery     reduction  . Colonoscopy  10/2018   Dr Lucila - SSA removed, random bx negative  . Lithotripsy    . Upper gastrointestinal endoscopy  2012   Pediatric History  Patient Parents  . Edwards,Sheila (Mother)   Other Topics Concern  . Not on file  Social History Narrative  . Not on file    Objective:   BP (!) 135/95 (BP Location: Right arm)   Pulse 93   Temp 97.3 F (36.3 C) (Temporal)   Resp 18   Ht 5' 2 (1.575 m)   Wt 147 lb (66.7 kg)   SpO2 95%   BMI 26.89 kg/m  Gen: Alert, oriented, non toxic, and well hydrated.  No signs of acute distress. Head: Normocephalic.  Atraumatic.  PERRLA.  Sclera anicteric.  Eyes: Extraocular movements intact.  Conjunctiva clear.  No foreign bodies noted. Ears:  Tympanic membranes clear.  Canals clear. Pharynx: No erythema or tonsillar hypertrophy.  Uvula midline. Neck: Supple. No lymphadenopathy Respiratory:  Lungs clear to auscultation.  No use of accessory muscles. Cardiovascular:  Regular rate and rhythm.  No murmurs noted Abdominal:  Soft, non tender, non distended.  No hepatosplenomegally Neuro: Cranial nerves intact grossly.  No loss of strength, sensation Extremities:  Full range of motion.  No cyanosis, clubbing, or edema. Skin:  No rashes noted Psych: Oriented, alert.  Assessment:     ICD-10-CM   1. Weight gain  R63.5 Ambulatory Referral to [facility]    lisdexamfetamine (VYVANSE) 30 mg capsule    2. Overweight (BMI 25.0-29.9)  E66.3 Ambulatory Referral to [facility]    3. Binge eating disorder  F50.81 lisdexamfetamine (VYVANSE) 30 mg capsule      Plan:   - will try to get vyvanse for binge eating disorder - referral to [facility] - rtc in 1 month for Medicare AWV - if vyvanse not approved she does have an adhd diagnosis as child/young adult so could try low dose adderall XR only. - Take all medications as prescribed. - Return to clinic to be reevaluated if symptoms  worsen, persist, change, or if you have any other concerns. - I discussed this diagnosis with the patient and discussed the treatment plan with them.  This treatment plan is also outlined in the Patient Instructions and a copy of this was provided to the patient. - Patient  verbalized to me that they understood what their problem is, what they need to do about it, and why it is important that they do it.  The patient/family voices understanding of all medications. No barriers to adherence were noted. Patient is taking all medications as prescribed and is tolerating well.  Plan for follow-up as discussed or as needed if any worsening symptoms or change in condition.  After Visit Summary was given to patient.

## 2021-06-03 NOTE — Progress Notes (Signed)
 SUBJECTIVE: Amanda Chang is a 36 y.o. female presenting for ADHD follow-up.  Taking xyzal for skin allergies. Stopped it and feels adderall is helping her skin issue as well.  Having left foot pain. Was walking for exercise. Bottom of foot has started hurting.   Interval history/ Academic progress/ Concerns: none  In the past she took 10mg  in am and 1  Medication side effects:  Headache?No Stomachache?No Appetite suppression?Yes Sleep disturbance?Yes having a harder time falling asleep Fast heart rate (palpitations)?No Other side effects?No   Is the medicine working soon enough after taking it?Yes Does it last long enough?Yes Overall, is this dose working out well?Yes  Any change to the family history since we saw you last?No  Review of Systems - All other systems were reviewed and are negative unless stated in HPI.  Family History  Problem Relation Age of Onset  . Anxiety disorder Mother   . Colon polyps Mother        59s, precancerous polyps  . Diabetes Father   . Alcohol abuse Brother   . Drug abuse Brother   . Arthritis Maternal Grandmother   . Cancer Maternal Grandmother        lung  . COPD Maternal Grandmother   . Cancer Paternal Grandmother        breast  . Colon cancer Neg Hx   . Inflammatory bowel disease Neg Hx   . Stomach cancer Neg Hx   . Rectal cancer Neg Hx   . Pancreatic cancer Neg Hx   . Ovarian cancer Neg Hx   . Endometrial cancer Neg Hx    Past Medical History:  Diagnosis Date  . Anxiety   . Colon polyp   . Depression   . Disease of thyroid gland   . Eczema   . Histrionic personality disorder (*)   . 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 Hypertension   . Renal disorder   . Substance abuse (*)    Past Surgical History:  Procedure Laterality Date  . Breast surgery     reduction  . Colonoscopy  10/2018   Dr Lucila - SSA removed, random bx negative  . Lithotripsy    . Upper gastrointestinal endoscopy  2012   Pediatric History   Patient Parents  . Edwards,Sheila (Mother)   Other Topics Concern  . Not on file  Social History Narrative  . Not on file    Any caffeine use?No Any tobacco use?No Any drug use?No  Cardiac Risk Hx for the patient and first-degree relatives: Anything new?No  OBJECTIVE:  Vitals:   06/03/21 0946  BP: 121/81  BP Location: Left arm  Patient Position: Sitting  Pulse: 79  Resp: 18  Temp: 97.7 F (36.5 C)  TempSrc: Temporal  SpO2: 97%  Weight: 143 lb (64.9 kg)  Height: 5' 2 (1.575 m)   General appearance: WDWN female.  Good eye contact.  Affect appropriate.  Mood not restricted. EENT: Eyes normal with normal conjunctivae bilaterally Neck: supple, no adenopathy Lungs:  clear, no wheezing or rales Heart: no murmur, regular rate and rhythm Abdomen: no masses palpated, no organomegaly or tenderness Skin: Normal  Neuro: 2+ DTR's bilaterally upper and lower extremities, normal gait and tone Extremities: FROM  I spent at least 15 minutes with the patient/family.  At least 50% of this time was spent in direct face-to-face contact.  We discussed the diagnosis and treatment plan in detail, and the family voiced understanding of the plan and follow-up.  ASSESSMENT:  1.  Medicare annual wellness visit, subsequent      2. Depressive disorder  Lipid Panel With LDL/HDL Ratio   Comprehensive metabolic panel   CBC And Differential   TSH Reflex T4Free/T3 Hormone    3. Anxiety  Lipid Panel With LDL/HDL Ratio   Comprehensive metabolic panel   CBC And Differential   TSH Reflex T4Free/T3 Hormone    4. Screening for lipid disorders  Lipid Panel With LDL/HDL Ratio   Comprehensive metabolic panel   CBC And Differential   TSH Reflex T4Free/T3 Hormone    5. Medication management  ToxASSURE Select 13    6. Other specified abnormal findings of blood chemistry  Lipid Panel With LDL/HDL Ratio    7. PTSD (post-traumatic stress disorder)      8. H/O phobia      9. Attention deficit  hyperactivity disorder (ADHD), combined type  amphetamine -dextroamphetamine  (ADDERALL XR) 10 mg 24 hr capsule      MEDICATION: (Prescriptions were given for 30 days, each) Orders Placed This Encounter  Procedures  . Lipid Panel With LDL/HDL Ratio  . Comprehensive metabolic panel  . CBC And Differential  . ToxASSURE Select 13  . TSH Reflex T4Free/T3 Hormone     PLAN: 3 month supply given -  Risk/benefit of medication discussed including side effect of appetite suppression, mood-related side effects, headache and stomachache. -  Reminder: Prescriptions for ADHD medicines cannot be called to the pharmacy, scripts must be picked up.  Refill requests should be made one week before the medication is needed.  Request can be made Monday-Friday 8 am to 5 pm. -  Patients must be seen at least annually for a complete physical exam.  And every 3 months for interim assessment of ADHD.  - Return to clinic to be reevaluated if symptoms worsen, persist, change, or if you have any other concerns. - Follow up in 3 months for repeat evaluation or sooner if needed - I discussed this diagnosis with the patient and discussed the treatment plan with them - this treatment plan is also outlined in the Patient Instructions and a copy of this was provided to the patient. - Patient  verbalized to me that they understood what their problem is, what they need to do about it, and why it is important that they do it.  The patient/family voices understanding of all medications. No barriers to adherence were noted. Patient is taking all medications as prescribed and is tolerating well.  Plan for follow-up as discussed or as needed if any worsening symptoms or change in condition.  After Visit Summary was given to patient.

## 2021-06-03 NOTE — Progress Notes (Signed)
 Medicare Subsequent AWV   Amanda Chang is a 36 y.o. female who presents for her subsequent annual wellness visit for Medicare.  Any physical exam components or additional concerns beyond the scope of the Annual Wellness Visit may be documented in a separate note within this encounter.  Health Risk Assessment   Current Living Arrangement: Alone During the past four weeks, how much pain in your body have you had on a scale of 0-10?: (!) Moderately severe pain (7-8) During the past four weeks, was someone available to help you if you needed and wanted help?: (!) No, not at all During the past four weeks, what was the hardest level of physical activity you could do for at least two minutes?: (!) Light Each night, how many hours of sleep do you usually get?: 6 Do you snore or has anyone told you that you snore?: (!) Yes Do you always fasten your seat belt when you are in a car?: Yes Do you drive after drinking alcohol or ride with a driver who has been drinking?: No How often during the past four weeks have you been bothered by sexual problems?: Never Do any of the following describe you?  Multiple sex partners and/or intercourse with partner of the same sex: No How often during the past four weeks have you been bothered by teeth or denture problems?: Never How often during the past four weeks have you been bothered by tiredness or fatigue?: Seldom During the past four weeks, how would you rate your health in general?: Very good What is your race? (Check all that apply): White   Substance Use Disorder and Opioid Risk Screening   Any identifiable risks for substance use disorder (illegal or inappropriate use of a controlled substance)?  Please select any that are applicable.: No identifiable risks Is patient currently using an opioid agonist medication routinely or PRN?: No   Depression Screening      06/03/2021    9:52 AM 08/10/2020    1:34 PM 05/06/2020    9:37  AM 06/25/2019    9:34 AM  Depression Screen  Please choose the category that best describes the patient's current state 1  0 0  Not eligible on the basis of Not applicable  Not applicable Not applicable  1. Little interest or pleasure in doing things 0  0 0  2. Feeling down, depressed, or hopeless 0  0 0  PHQ-2 Total Score 0  0 0  PHQ-2 Interpretation of Score for Depression (Payor) Negative  Negative Negative  3. Trouble falling or staying asleep 0     4. Feeling tired or having little energy 0     5. Poor appetite or overeating 0     6. Feeling bad about yourself - or that you are a failure or have let yourself or your family down 0     7. Trouble concentrating on things, such as reading the newspaper or watching television 0     8. Moving or speaking so slowly that other people could have noticed.  Or the opposite - being so fidgety or restless that you have been moving around a lot more than usual. 0     9. Thoughts that you would be better off dead, or of hurting yourself in some way. 0     10. How difficult have these problems made it for you to do your work, take care of things at home or get along with other  people? Not difficult at all     PHQ Total Score 0     Interpretation: Minimal or No Depression     PHQ-9 Interpretation of Score for Depression (Payor) Negative     Wish to be Dead:  No    Suicidal Thoughts:  No    Suicide Behavior Question:  No    C-SSRS Screening Result  No Risk        Cognitive and Functional Assessments   Is the person deaf or does he/she have serious difficulty hearing?: No Is this person blind or does he/she have serious difficulty seeing even when wearing glasses?: No Do you/patient have serious difficulty concentrating, remembering, or making decisions?: No Have you had any concerns about changes in your memory or concentration?: No  ADL Assessment   Please select any of the following that the person has serious difficulty managing on their  own:: none apply Please select any of the following that the person has serious difficulty managing on their own:: none apply   Social Determinants of Health (SDoH) and Personal Data   On average, how many days per week do you engage in moderate to strenuous exercise (like a brisk walk)?: 2 days On average, how many minutes do you engage in exercise at this level?: 120 min How hard is it for you to pay for the very basics like food, housing, medical care, and heating?: Not hard at all Within the past 12 months, you worried that your food would run out before you got the money to buy more.: Never true Within the past 12 months, the food you bought just didn't last and you didn't have money to get more.: Never true Do you feel stress - tense, restless, nervous, or anxious, or unable to sleep at night because your mind is troubled all the time - these days?: Not at all How would you rate your social network (family, work, friends)?: Adequate participation with social networks Over the last 12 months how often did your partner physically hurt you?: Never Over the last 12 months how often did your partner insult you or talk down to you?: Never Over the last 12 months how often did your partner threaten you with physical harm?: Never Over the last 12 months how often did your partner scream or curse at you?: Never HITS Total Score: 4 HITS Interpretation: Negative Q1: How often do you have a drink containing alcohol?: Never Do you depend on people living with you for personal care?: No Are there any cultural or religious beliefs that your healthcare provider should be aware of that would be helpful in your health care? : No  Advance Care Directives   Do you have a living will?: (!) No Do you have a Midwife?: (!) No  Falls Risk Screening      Patient can ambulate: Yes Do you feel unsteady when standing or walking?: No Do you worry about falling?: No Have you fallen in  the past year?: No  Medicare Required Components     Reviewed and updated this visit by provider: None        Cognitive screen indicated?: No Based on my direct observation, with due consideration of information obtained via beneficiary reports and communication with family members/care takers, further cognitive assessment is not indicated.  Dietary issues addressed:: Yes HRA completed and reviewed:: Yes Care Team updated:: Yes Advance care directives discussed and information provided if necessary:: Yes  Patient Care Team: Aldona LITTIE Pizza,  NP as PCP - General (Family Medicine) Alm Dames, MD as Consulting Physician (Gastroenterology) Odella CROME Ward, NP as Nurse Practitioner (Nurse Practitioner) Lavern Meadows, RD (Dietician)  Vitals    Vitals:   06/03/21 0946  BP: 121/81  Patient Position: Sitting  Pulse: 79  Temp: 97.7 F (36.5 C)  TempSrc: Temporal  Resp: 18  Height: 5' 2 (1.575 m)  Weight: 143 lb (64.9 kg)  SpO2: 97%  BMI (Calculated): 26.1  PainSc: 0-No pain    Disposition   1. Medicare annual wellness visit, subsequent (Primary) 2. Depressive disorder -     Lipid Panel With LDL/HDL Ratio; Future -     Comprehensive metabolic panel; Future -     CBC And Differential; Future -     TSH Reflex T4Free/T3 Hormone; Future 3. Anxiety -     Lipid Panel With LDL/HDL Ratio; Future -     Comprehensive metabolic panel; Future -     CBC And Differential; Future -     TSH Reflex T4Free/T3 Hormone; Future 4. Screening for lipid disorders -     Lipid Panel With LDL/HDL Ratio; Future -     Comprehensive metabolic panel; Future -     CBC And Differential; Future -     TSH Reflex T4Free/T3 Hormone; Future 5. Medication management -     ToxASSURE Select 13; Future 6. Other specified abnormal findings of blood chemistry -     Lipid Panel With LDL/HDL Ratio; Future    No follow-ups on file.   Health maintenance issues including appropriate cancer screening, annual  eye exam, healthy diet, exercise and tobacco avoidance were discussed with the patient.  A written plan was provided to the patient in the form of patient instructions in the after visit summary.

## 2021-06-07 ENCOUNTER — Emergency Department (HOSPITAL_BASED_OUTPATIENT_CLINIC_OR_DEPARTMENT_OTHER): Payer: Medicare Other

## 2021-06-07 ENCOUNTER — Encounter (HOSPITAL_BASED_OUTPATIENT_CLINIC_OR_DEPARTMENT_OTHER): Payer: Self-pay

## 2021-06-07 ENCOUNTER — Other Ambulatory Visit: Payer: Self-pay

## 2021-06-07 ENCOUNTER — Other Ambulatory Visit (HOSPITAL_BASED_OUTPATIENT_CLINIC_OR_DEPARTMENT_OTHER): Payer: Self-pay

## 2021-06-07 ENCOUNTER — Emergency Department (HOSPITAL_BASED_OUTPATIENT_CLINIC_OR_DEPARTMENT_OTHER)
Admission: EM | Admit: 2021-06-07 | Discharge: 2021-06-07 | Disposition: A | Discharge status: Home / Self Care | Payer: Medicare Other | Attending: Emergency Medicine | Admitting: Emergency Medicine

## 2021-06-07 DIAGNOSIS — N132 Hydronephrosis with renal and ureteral calculous obstruction: Secondary | ICD-10-CM | POA: Insufficient documentation

## 2021-06-07 DIAGNOSIS — N200 Calculus of kidney: Secondary | ICD-10-CM

## 2021-06-07 DIAGNOSIS — R109 Unspecified abdominal pain: Secondary | ICD-10-CM | POA: Diagnosis present

## 2021-06-07 LAB — COMPREHENSIVE METABOLIC PANEL
ALT: 10 U/L (ref 0–44)
AST: 17 U/L (ref 15–41)
Albumin: 4.3 g/dL (ref 3.5–5.0)
Alkaline Phosphatase: 57 U/L (ref 38–126)
Anion gap: 7 (ref 5–15)
BUN: 16 mg/dL (ref 6–20)
CO2: 21 mmol/L — ABNORMAL LOW (ref 22–32)
Calcium: 9 mg/dL (ref 8.9–10.3)
Chloride: 108 mmol/L (ref 98–111)
Creatinine, Ser: 0.86 mg/dL (ref 0.44–1.00)
GFR, Estimated: >60 mL/min (ref >60)
Glucose, Bld: 105 mg/dL — ABNORMAL HIGH (ref 70–99)
Potassium: 3.2 mmol/L — ABNORMAL LOW (ref 3.5–5.1)
Sodium: 136 mmol/L (ref 135–145)
Total Bilirubin: 3.4 mg/dL — ABNORMAL HIGH (ref 0.3–1.2)
Total Protein: 7.2 g/dL (ref 6.5–8.1)

## 2021-06-07 LAB — CBC WITH DIFFERENTIAL/PLATELET
Abs Immature Granulocytes: 0.02 10*3/uL (ref 0.00–0.07)
Basophils Absolute: 0 10*3/uL (ref 0.0–0.1)
Basophils Relative: 1 %
Eosinophils Absolute: 0 10*3/uL (ref 0.0–0.5)
Eosinophils Relative: 0 %
HCT: 43 % (ref 36.0–46.0)
Hemoglobin: 15.4 g/dL — ABNORMAL HIGH (ref 12.0–15.0)
Immature Granulocytes: 0 %
Lymphocytes Relative: 27 %
Lymphs Abs: 1.6 10*3/uL (ref 0.7–4.0)
MCH: 33 pg (ref 26.0–34.0)
MCHC: 35.8 g/dL (ref 30.0–36.0)
MCV: 92.1 fL (ref 80.0–100.0)
Monocytes Absolute: 0.5 10*3/uL (ref 0.1–1.0)
Monocytes Relative: 8 %
Neutro Abs: 3.7 10*3/uL (ref 1.7–7.7)
Neutrophils Relative %: 64 %
Platelets: 245 10*3/uL (ref 150–400)
RBC: 4.67 MIL/uL (ref 3.87–5.11)
RDW: 11.9 % (ref 11.5–15.5)
WBC: 5.8 10*3/uL (ref 4.0–10.5)
nRBC: 0 % (ref 0.0–0.2)

## 2021-06-07 LAB — URINALYSIS, MICROSCOPIC (REFLEX): RBC / HPF: >50 RBC/hpf (ref 0–5)

## 2021-06-07 LAB — URINALYSIS, ROUTINE W REFLEX MICROSCOPIC
Glucose, UA: NEGATIVE mg/dL
Ketones, ur: >=80 mg/dL — AB
Leukocytes,Ua: NEGATIVE
Nitrite: NEGATIVE
Protein, ur: 100 mg/dL — AB
Specific Gravity, Urine: >=1.03 (ref 1.005–1.030)
pH: 6 (ref 5.0–8.0)

## 2021-06-07 LAB — PREGNANCY, URINE: Preg Test, Ur: NEGATIVE

## 2021-06-07 MED ORDER — DROPERIDOL 2.5 MG/ML IJ SOLN
1.2500 mg | Freq: Once | INTRAMUSCULAR | Status: AC
  Administered 2021-06-07: 1.25 mg via INTRAVENOUS
  Filled 2021-06-07: qty 2

## 2021-06-07 MED ORDER — ONDANSETRON HCL 4 MG/2ML IJ SOLN
4.0000 mg | Freq: Once | INTRAMUSCULAR | Status: AC
Start: 2021-06-07 — End: 2021-06-07
  Administered 2021-06-07: 4 mg via INTRAVENOUS
  Filled 2021-06-07: qty 2

## 2021-06-07 MED ORDER — KETOROLAC TROMETHAMINE 15 MG/ML IJ SOLN
15.0000 mg | Freq: Once | INTRAMUSCULAR | Status: AC
  Administered 2021-06-07: 15 mg via INTRAVENOUS
  Filled 2021-06-07: qty 1

## 2021-06-07 MED ORDER — FENTANYL CITRATE PF 50 MCG/ML IJ SOSY
50.0000 ug | PREFILLED_SYRINGE | Freq: Once | INTRAMUSCULAR | Status: AC
  Administered 2021-06-07: 50 ug via INTRAVENOUS
  Filled 2021-06-07: qty 1

## 2021-06-07 MED ORDER — OXYCODONE HCL 5 MG PO TABS
5.0000 mg | ORAL_TABLET | Freq: Four times a day (QID) | ORAL | 0 refills | Status: DC | PRN
  Filled 2021-06-07: qty 10, 3d supply, fill #0

## 2021-06-07 MED ORDER — TAMSULOSIN HCL 0.4 MG PO CAPS
0.4000 mg | ORAL_CAPSULE | Freq: Every day | ORAL | 0 refills | Status: DC
  Filled 2021-06-07: qty 30, 30d supply, fill #0

## 2021-06-07 MED ORDER — ONDANSETRON 4 MG PO TBDP
4.0000 mg | ORAL_TABLET | Freq: Three times a day (TID) | ORAL | 0 refills | Status: DC | PRN
  Filled 2021-06-07: qty 20, 7d supply, fill #0

## 2021-06-07 MED ORDER — HYDROMORPHONE HCL 1 MG/ML IJ SOLN
1.0000 mg | Freq: Once | INTRAMUSCULAR | Status: AC
  Administered 2021-06-07: 1 mg via INTRAVENOUS
  Filled 2021-06-07: qty 1

## 2021-06-07 NOTE — ED Triage Notes (Signed)
Pt presents with left flank radiating to left groin that began 10 am

## 2021-06-07 NOTE — ED Provider Notes (Signed)
[facility] EMERGENCY DEPARTMENT Provider Note   CSN: [id number] Arrival date & time: 06/07/21  1030     History  Chief Complaint  Patient presents with   Flank Pain    Amanda Chang is a 36 y.o. female.  Presents to ER due to concern for flank pain.  Pain started this morning around 10 AM relatively suddenly.  Has felt relatively constant.  Up to 10 out of 10 in severity.  Some associated nausea, no vomiting.  No fevers or chills.  Has prior history of kidney stones and this feels very similar.  Denies any major medical problems.  HPI     Home Medications Prior to Admission medications   Medication Sig Start Date End Date Taking? Authorizing Provider  ondansetron (ZOFRAN-ODT) 4 MG disintegrating tablet Take 1 tablet (4 mg total) by mouth every 8 (eight) hours as needed for nausea or vomiting. 06/07/21  Yes Evi Mccomb, Ellwood Dense, MD  oxyCODONE (ROXICODONE) 5 MG immediate release tablet Take 1 tablet (5 mg total) by mouth every 6 (six) hours as needed for severe pain. 06/07/21  Yes Lucrezia Starch, MD  tamsulosin (FLOMAX) 0.4 MG CAPS capsule Take 1 capsule (0.4 mg total) by mouth daily. 06/07/21  Yes Lucrezia Starch, MD  medroxyPROGESTERone (DEPO-PROVERA) 150 MG/ML injection Inject 150 mg into the muscle every 3 (three) months.    [provider]  tiZANidine (ZANAFLEX) 4 MG capsule Take 4 mg by mouth in the morning and at bedtime.    [provider]      Allergies    Acetaminophen and Belladonna alkaloids    Review of Systems   Review of Systems  Constitutional:  Negative for chills and fever.  HENT:  Negative for ear pain and sore throat.   Eyes:  Negative for pain and visual disturbance.  Respiratory:  Negative for cough and shortness of breath.   Cardiovascular:  Negative for chest pain and palpitations.  Gastrointestinal:  Positive for nausea. Negative for abdominal pain and vomiting.  Genitourinary:  Positive for flank pain.  Negative for dysuria and hematuria.  Musculoskeletal:  Negative for arthralgias and back pain.  Skin:  Negative for color change and rash.  Neurological:  Negative for seizures and syncope.  All other systems reviewed and are negative.  Physical Exam Updated Vital Signs BP (!) 136/108 (BP Location: Left Arm)   Pulse 80   Temp 97.8 F (36.6 C) (Oral)   Resp 19   Ht 5\' 2"  (1.575 m)   Wt 65.8 kg   LMP 05/24/2021 (Approximate)   SpO2 98%   BMI 26.52 kg/m  Physical Exam Vitals and nursing note reviewed.  Constitutional:      General: She is not in acute distress.    Appearance: She is well-developed.     Comments: Uncomfortable from pain but no distress  HENT:     Head: Normocephalic and atraumatic.  Eyes:     Conjunctiva/sclera: Conjunctivae normal.  Cardiovascular:     Rate and Rhythm: Normal rate and regular rhythm.     Heart sounds: No murmur heard. Pulmonary:     Effort: Pulmonary effort is normal. No respiratory distress.     Breath sounds: Normal breath sounds.  Abdominal:     Palpations: Abdomen is soft.     Tenderness: There is no abdominal tenderness.     Comments: Some tenderness to left flank  Musculoskeletal:        General: No swelling.     Cervical  back: Neck supple.  Skin:    General: Skin is warm and dry.     Capillary Refill: Capillary refill takes less than 2 seconds.  Neurological:     Mental Status: She is alert.  Psychiatric:        Mood and Affect: Mood normal.    ED Results / Procedures / Treatments   Labs (all labs ordered are listed, but only abnormal results are displayed) Labs Reviewed  URINALYSIS, ROUTINE W REFLEX MICROSCOPIC - Abnormal; Notable for the following components:      Result Value   APPearance CLOUDY (*)    Hgb urine dipstick LARGE (*)    Bilirubin Urine SMALL (*)    Ketones, ur >=80 (*)    Protein, ur 100 (*)    All other components within normal limits  CBC WITH DIFFERENTIAL/PLATELET - Abnormal; Notable for the  following components:   Hemoglobin 15.4 (*)    All other components within normal limits  COMPREHENSIVE METABOLIC PANEL - Abnormal; Notable for the following components:   Potassium 3.2 (*)    CO2 21 (*)    Glucose, Bld 105 (*)    Total Bilirubin 3.4 (*)    All other components within normal limits  URINALYSIS, MICROSCOPIC (REFLEX) - Abnormal; Notable for the following components:   Bacteria, UA FEW (*)    All other components within normal limits  PREGNANCY, URINE    EKG None  Radiology CT Renal Stone Study  Result Date: 06/07/2021 CLINICAL DATA:  Flank pain, kidney stone suspected EXAM: CT ABDOMEN AND PELVIS WITHOUT CONTRAST TECHNIQUE: Multidetector CT imaging of the abdomen and pelvis was performed following the standard protocol without IV contrast. RADIATION DOSE REDUCTION: This exam was performed according to the departmental dose-optimization program which includes automated exposure control, adjustment of the mA and/or kV according to patient size and/or use of iterative reconstruction technique. COMPARISON:  CT dated April fifteenth 2021. FINDINGS: Lower chest: No acute abnormality. Hepatobiliary: Unremarkable noncontrast appearance of the liver and gallbladder. Pancreas: No peripancreatic fat stranding. Spleen: Unremarkable. Adrenals/Urinary Tract: Adrenal glands are unremarkable. There is an obstructing 6 mm ureterolithiasis in the proximal LEFT ureter with mild upstream hydroureteronephrosis and mild adjacent fat stranding. Nonobstructive RIGHT-sided nephrolithiasis. No RIGHT-sided hydronephrosis. Bladder is completely decompressed. Stomach/Bowel: No evidence of bowel obstruction. Tiny hiatal hernia. No evidence of appendicitis. Vascular/Lymphatic: No significant vascular findings are present. No enlarged abdominal or pelvic lymph nodes. Reproductive: Uterus and bilateral adnexa are unremarkable. Other: Small volume free fluid in the pelvis, physiologic. No free air. Musculoskeletal:  No acute or significant osseous findings. IMPRESSION: 1. There is an obstructing 6 mm ureterolithiasis in the proximal LEFT ureter with mild upstream hydroureteronephrosis and adjacent fat stranding. Electronically Signed   By: Valentino Saxon M.D.   On: 06/07/2021 12:39    Procedures Procedures    Medications Ordered in ED Medications  ondansetron (ZOFRAN) injection 4 mg (4 mg Intravenous Given 06/07/21 1045)  fentaNYL (SUBLIMAZE) injection 50 mcg (50 mcg Intravenous Given 06/07/21 1047)  ketorolac (TORADOL) 15 MG/ML injection 15 mg (15 mg Intravenous Given 06/07/21 1144)  droperidol (INAPSINE) 2.5 MG/ML injection 1.25 mg (1.25 mg Intravenous Given 06/07/21 1329)  HYDROmorphone (DILAUDID) injection 1 mg (1 mg Intravenous Given 06/07/21 1328)    ED Course/ Medical Decision Making/ A&P                           Medical Decision Making Amount and/or Complexity of Data Reviewed Labs:  ordered. Radiology: ordered.  Risk Prescription drug management.   36 year old lady presents to ER due to concern for flank pain and nausea.  I independently reviewed CT imaging, findings concerning for 6 mm left ureteral stone.  Suspect culprit for her symptoms today.  No leukocytosis, no AKI on lab review.  UA negative for UTI, afebrile, will defer antibiotics at this time.  Patient given multiple rounds of antiemetic and analgesic intravenously.  Multiple reassessments.  Symptoms improving.  On final reassessment, patient reports that her symptoms have near completely resolved.  Vitals are remaining stable and patient remains well-appearing.  Feel she is appropriate for discharge and does not require hospital admission at this time for symptom control.  I advised follow-up with urology.  Gave Rx for some pain and nausea medicine and Flomax.  Reviewed return precautions and discharge.        Final Clinical Impression(s) / ED Diagnoses Final diagnoses:  Kidney stone    Rx / DC Orders ED Discharge  Orders          Ordered    oxyCODONE (ROXICODONE) 5 MG immediate release tablet  Every 6 hours PRN        06/07/21 1420    ondansetron (ZOFRAN-ODT) 4 MG disintegrating tablet  Every 8 hours PRN        06/07/21 1420    tamsulosin (FLOMAX) 0.4 MG CAPS capsule  Daily        06/07/21 1420              Lucrezia Starch, MD 06/07/21 1421

## 2021-06-07 NOTE — ED Notes (Signed)
Dc instructions and scripts reviewed with pt no questions or concerns at this time. Pt wheeled to lobby to be transported home by family member

## 2021-06-07 NOTE — Discharge Instructions (Addendum)
Please follow-up with the urologist.  Come back to ER if you are having uncontrollable pain and vomiting or any sort of fever.  For pain control take Tylenol and Motrin as needed.  For breakthrough pain take the prescribed oxycodone.  Note this can make you drowsy and should not be taken while driving.  For nausea take the Zofran as needed.

## 2021-06-09 ENCOUNTER — Other Ambulatory Visit: Payer: Self-pay | Admitting: Urology

## 2021-06-09 ENCOUNTER — Other Ambulatory Visit (HOSPITAL_BASED_OUTPATIENT_CLINIC_OR_DEPARTMENT_OTHER): Payer: Self-pay

## 2021-06-09 MED ORDER — OXYCODONE HCL 5 MG PO TABS
5.0000 mg | ORAL_TABLET | Freq: Four times a day (QID) | ORAL | 0 refills | Status: DC | PRN
  Filled 2021-06-09: qty 15, 4d supply, fill #0

## 2021-06-14 ENCOUNTER — Other Ambulatory Visit (HOSPITAL_BASED_OUTPATIENT_CLINIC_OR_DEPARTMENT_OTHER): Payer: Self-pay

## 2021-06-14 MED ORDER — OXYCODONE-ACETAMINOPHEN 5-325 MG PO TABS
ORAL_TABLET | ORAL | 0 refills | Status: DC
  Filled 2021-06-14: qty 25, 5d supply, fill #0

## 2021-06-14 MED ORDER — OXYCODONE HCL 5 MG PO TABS
ORAL_TABLET | ORAL | 0 refills | Status: DC
  Filled 2021-06-14: qty 25, 7d supply, fill #0

## 2021-06-14 MED ORDER — ONDANSETRON HCL 4 MG PO TABS
ORAL_TABLET | ORAL | 1 refills | Status: DC
  Filled 2021-06-14: qty 30, 8d supply, fill #0
  Filled 2021-06-28: qty 30, 8d supply, fill #1

## 2021-06-21 ENCOUNTER — Other Ambulatory Visit (HOSPITAL_BASED_OUTPATIENT_CLINIC_OR_DEPARTMENT_OTHER): Payer: Self-pay

## 2021-06-21 MED ORDER — OXYCODONE HCL 5 MG PO TABS
ORAL_TABLET | ORAL | 0 refills | Status: DC
  Filled 2021-06-21: qty 18, 5d supply, fill #0

## 2021-06-21 NOTE — Progress Notes (Signed)
Talked with patient. Instructions given. Hx and meds  reviewed. Driver secured

## 2021-06-23 ENCOUNTER — Ambulatory Visit (HOSPITAL_COMMUNITY): Payer: Medicare Other

## 2021-06-23 ENCOUNTER — Encounter (HOSPITAL_BASED_OUTPATIENT_CLINIC_OR_DEPARTMENT_OTHER)
Admission: RE | Disposition: A | Discharge status: Home / Self Care | Payer: Self-pay | Source: Home / Self Care | Attending: Urology

## 2021-06-23 ENCOUNTER — Encounter (HOSPITAL_BASED_OUTPATIENT_CLINIC_OR_DEPARTMENT_OTHER): Payer: Self-pay | Admitting: Urology

## 2021-06-23 ENCOUNTER — Ambulatory Visit (HOSPITAL_BASED_OUTPATIENT_CLINIC_OR_DEPARTMENT_OTHER)
Admission: RE | Admit: 2021-06-23 | Discharge: 2021-06-23 | Disposition: A | Discharge status: Home / Self Care | Payer: Medicare Other | Attending: Urology | Admitting: Urology

## 2021-06-23 DIAGNOSIS — Z01818 Encounter for other preprocedural examination: Secondary | ICD-10-CM

## 2021-06-23 DIAGNOSIS — N202 Calculus of kidney with calculus of ureter: Secondary | ICD-10-CM | POA: Insufficient documentation

## 2021-06-23 HISTORY — PX: EXTRACORPOREAL SHOCK WAVE LITHOTRIPSY: SHX1557

## 2021-06-23 LAB — POCT PREGNANCY, URINE: Preg Test, Ur: NEGATIVE

## 2021-06-23 SURGERY — LITHOTRIPSY, ESWL
Anesthesia: LOCAL | Laterality: Left

## 2021-06-23 MED ORDER — DIAZEPAM 5 MG PO TABS
ORAL_TABLET | ORAL | Status: AC
  Filled 2021-06-23: qty 2

## 2021-06-23 MED ORDER — DIAZEPAM 5 MG PO TABS
10.0000 mg | ORAL_TABLET | ORAL | Status: AC
  Administered 2021-06-23: 10 mg via ORAL

## 2021-06-23 MED ORDER — CIPROFLOXACIN HCL 500 MG PO TABS
ORAL_TABLET | ORAL | Status: AC
  Filled 2021-06-23: qty 1

## 2021-06-23 MED ORDER — CIPROFLOXACIN HCL 500 MG PO TABS
500.0000 mg | ORAL_TABLET | ORAL | Status: AC
  Administered 2021-06-23: 500 mg via ORAL

## 2021-06-23 MED ORDER — DIPHENHYDRAMINE HCL 25 MG PO CAPS
ORAL_CAPSULE | ORAL | Status: AC
  Filled 2021-06-23: qty 1

## 2021-06-23 MED ORDER — SODIUM CHLORIDE 0.9 % IV SOLN: INTRAVENOUS | Status: DC

## 2021-06-23 MED ORDER — DIPHENHYDRAMINE HCL 25 MG PO CAPS
25.0000 mg | ORAL_CAPSULE | ORAL | Status: AC
  Administered 2021-06-23: 25 mg via ORAL

## 2021-06-23 NOTE — H&P (Signed)
See HP scanned into Epic 

## 2021-06-23 NOTE — Discharge Instructions (Addendum)
See [facility] discharge instructions in chart.  Post Anesthesia Home Care Instructions  Activity: Get plenty of rest for the remainder of the day. A responsible adult should stay with you for 24 hours following the procedure.  For the next 24 hours, DO NOT: -Drive a car -Operate machinery -Drink alcoholic beverages -Take any medication unless instructed by your physician -Make any legal decisions or sign important papers.  Meals: Start with liquid foods such as gelatin or soup. Progress to regular foods as tolerated. Avoid greasy, spicy, heavy foods. If nausea and/or vomiting occur, drink only clear liquids until the nausea and/or vomiting subsides. Call your physician if vomiting continues.  Special Instructions/Symptoms: Your throat may feel dry or sore from the anesthesia or the breathing tube placed in your throat during surgery. If this causes discomfort, gargle with warm salt water. The discomfort should disappear within 24 hours.  If you had a scopolamine patch placed behind your ear for the management of post- operative nausea and/or vomiting:  1. The medication in the patch is effective for 72 hours, after which it should be removed.  Wrap patch in a tissue and discard in the trash. Wash hands thoroughly with soap and water. 2. You may remove the patch earlier than 72 hours if you experience unpleasant side effects which may include dry mouth, dizziness or visual disturbances. 3. Avoid touching the patch. Wash your hands with soap and water after contact with the patch.    

## 2021-06-23 NOTE — Op Note (Signed)
See [facility] OP note scanned into chart. Also because of the size, density, location and other factors that cannot be anticipated I feel this will likely be a staged procedure. This fact supersedes any indication in the scanned [facility] operative note to the contrary.  

## 2021-06-27 ENCOUNTER — Encounter (HOSPITAL_BASED_OUTPATIENT_CLINIC_OR_DEPARTMENT_OTHER): Payer: Self-pay | Admitting: Urology

## 2021-06-28 ENCOUNTER — Other Ambulatory Visit (HOSPITAL_BASED_OUTPATIENT_CLINIC_OR_DEPARTMENT_OTHER): Payer: Self-pay

## 2021-06-30 ENCOUNTER — Other Ambulatory Visit (HOSPITAL_BASED_OUTPATIENT_CLINIC_OR_DEPARTMENT_OTHER): Payer: Self-pay

## 2021-06-30 MED ORDER — OXYCODONE HCL 5 MG PO TABS
ORAL_TABLET | ORAL | 0 refills | Status: DC
  Filled 2021-06-30: qty 18, 5d supply, fill #0

## 2021-08-12 NOTE — Progress Notes (Signed)
 Subjective:   Amanda Chang is a 36 y.o. female here for evaluation of back pain.  She recently painted her whole house and having pain in pinched area pack of right shoulder. In low back feels a weakness in low back area. At first she would have to roll over like a log in the bed. That is improving.   Still ffeeling pain in heels when walking. Doing stretchign activities. Ice. Inserts.   Review of Systems - All other systems were reviewed and are negative unless stated in HPI.  Family History  Problem Relation Age of Onset  . Anxiety disorder Mother   . Colon polyps Mother        79s, precancerous polyps  . Diabetes Father   . Alcohol abuse Brother   . Drug abuse Brother   . Arthritis Maternal Grandmother   . Cancer Maternal Grandmother        lung  . COPD Maternal Grandmother   . Cancer Paternal Grandmother        breast  . Colon cancer Neg Hx   . Inflammatory bowel disease Neg Hx   . Stomach cancer Neg Hx   . Rectal cancer Neg Hx   . Pancreatic cancer Neg Hx   . Ovarian cancer Neg Hx   . Endometrial cancer Neg Hx    Past Medical History:  Diagnosis Date  . Anxiety   . Colon polyp   . Depression   . Disease of thyroid gland   . Eczema   . Histrionic personality disorder (*)   . 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 Hypertension   . Renal disorder   . Substance abuse (*)    Past Surgical History:  Procedure Laterality Date  . Breast surgery     reduction  . Colonoscopy  10/2018   Dr Lucila - SSA removed, random bx negative  . Lithotripsy    . Upper gastrointestinal endoscopy  2012   Pediatric History  Patient Parents  . Edwards,Sheila (Mother)   Other Topics Concern  . Not on file  Social History Narrative  . Not on file    Objective:   BP 138/88 (BP Location: Left arm, Patient Position: Sitting)   Pulse 88   Temp 97.6 F (36.4 C) (Temporal)   Resp 18   Ht 5' 2 (1.575 m)   Wt 145 lb (65.8 kg)   SpO2 97%   BMI 26.52 kg/m  General:  Well-developed, well-nourished in no acute distress. Alert and oriented x 3. Affect is normal  HEENT: Normocephalic, atraumatic cranium.  Neck: Supple  Abd: Soft, nontender, nondistended. Positive bowel sounds.  Ext: Without cyanosis, clubbing, edema. Pedal pulses are 2+. Back: No cervical, thoracic, or lumbar spine tenderness.  No cervical, thoracic, or lumbar paraspinous tenderness. MS: FROM x 4, and 5/5 strength throughout of bilateral lower extremities.  Neuro: Cranial nerves II-XII are intact. Sensory examination is normal. Skin: Warm, dry and intact without rash or lesion.  No bruising or bleeding.  Assessment:     ICD-10-CM   1. Acute low back pain, unspecified back pain laterality, unspecified whether sciatica present  M54.50 methylPREDNISolone acetate (DEPO-MEDROL) injection 80 mg    ketorolac  tromethamine  (TORADOL ) injection 60 mg    methylPREDNISolone (MEDROL DOSEPACK) 4 mg tablet    tiZANidine  (ZANAFLEX ) 4 mg tablet    2. Muscle strain  T14.8XXA tiZANidine  (ZANAFLEX ) 4 mg tablet    3. Plantar fasciitis  M72.2       Plan:  No orders  of the defined types were placed in this encounter.  - depo medrol and toradol  in office - start medrol dose pack in am with food - tizanidine  as directed - Take all medications as prescribed.   - Encouraged heat, nsaids, and muscle relaxer.  Continue to stretch.  - Encouraged massage therapy. -Return to clinic to be reevaluated if symptoms worsen, persist, change, or if you have any other concerns. - I discussed this diagnosis with the patient and discussed the treatment plan with them - this treatment plan is also outlined in the Patient Instructions and a copy of this was provided to the patient. - Patient  verbalized to me that they understood what their problem is, what they need to do about it, and why it is important that they do it.  The patient/family voices understanding of all medications. No barriers to adherence were noted.  Patient is taking all medications as prescribed and is tolerating well.  Plan for follow-up as discussed or as needed if any worsening symptoms or change in condition.  After Visit Summary was given to patient.

## 2021-08-26 NOTE — Telephone Encounter (Signed)
 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  Please see MyChart messages for confirmation of the diagnosis.  Evidently this is a longstanding intermittent problem.  We will not prescribe any controlled substances for her in the future.

## 2021-08-26 NOTE — Telephone Encounter (Signed)
 Patient continues to message erratic thoughts in [employer].   Spoke to mother again this morning, who advised she once again has been checking on her and she feels she is not a threat to self or others still. She advised patient will have normal conversation with her but when she asked her why she canceled her appointment she goes into another erratic rant about her being in [employer] and that everyone at the medical office is infected with parasites and dangerous. Mother states this is her usual psychosis when abusing her medication, but that patient will not willing give her the medication. Mother states she has tried having her committed for this previously and psych never keeps her.  Mother will continue to check on patient and mother is aware of cardiovascular risk to patient abusing stimulants to also monitor.   Spoke again with Dr Bobbette who agrees that sending officer for well fair check would not accomplish anything at this point. He is putting in a stat psych referral and mother will continue to encourage patient to come into office for evaluation. Our office will no longer prescribe her any controlled substances per Dr Bobbette.

## 2023-02-14 NOTE — Progress Notes (Signed)
 [facility] Ophthalmology - [facility] Visit Note 02/14/23     CHIEF COMPLAINT Patient presents for Blurred Vision   HISTORY OF PRESENT ILLNESS: Amanda Chang is a 38 y.o. female who presents to the clinic today for:   HPI   New patient.  Last Exam 7 years ago, unsure.    Patient c/o blurry distance  vision in both eyes, h/o wearing glasses but they broke years ago.  Has noticed she has had to hold reading material closer to see it more clearly. No complaints of glare in bright sunshine. Denies glare at night with halos or starbursts around lights. C/O dryness both eyes and tearing left eye more recently. Denies eye pain, redness or photophobia. Sees floaters on occasion. Denies flashes or dark curtain/veil.   No gtts or ung  Denies self h/o eye disease. F/o glaucoma, maternal grandmother.  Denies prior eye surgery, lasers or trauma.   Last edited by Tinnie MARLA Bolds, COA on 02/14/2023  8:22 AM.      CURRENT MEDICATIONS: Current Outpatient Medications on File Prior to Visit  Medication Sig Dispense Refill  . divalproex  (DEPAKOTE ) 500 mg extended release tablet Take 1,000 mg by mouth nightly. Indications: bipolar depression 60 tablet 0  . fluconazole  (DIFLUCAN ) 200 mg tablet TAKE 1 TABLET BY MOUTH EVERY OTHER DAY AS NEEDED WITH FOOD    . hydroCHLOROthiazide  (HYDRODIURIL ) 25 mg tablet Take 25 mg by mouth Once Daily. Indications: high blood pressure 30 tablet 0  . ketoconazole (NIZORAL) 2 % cream APPLY TO AFFECTED AREA OF ITCH TWICE A DAY FOR 14 DAYS    . ketoconazole (NIZORAL) 2 % shampoo     . levocetirizine (XYZAL) 5 mg tablet     . medroxyPROGESTERone  (DEPO-PROVERA ) 150 mg/mL injection     . nystatin (MYCOSTATIN) 100,000 unit/mL suspension     . OLANZapine  (ZyPREXA ) 20 mg tablet Take 20 mg by mouth nightly. Indications: schizophrenia 30 tablet 0  . SUMAtriptan (IMITREX) 100 mg tablet Take 100 mg by mouth.    . tiZANidine  (ZANAFLEX ) 4 mg capsule Take 4 mg by mouth.      No current facility-administered medications on file prior to visit.    Referring physician: No referring provider defined for this encounter.  ALLERGIES Allergies  Allergen Reactions  . Acetaminophen  Rash    PAST MEDICAL HISTORY Past Medical History:  Diagnosis Date  . Hypertension    History reviewed. No pertinent surgical history.  FAMILY HISTORY Family History  Problem Relation Name Age of Onset  . Cancer Father    . Diabetes Father    . Psoriasis Neg Hx    . Eczema Neg Hx      SOCIAL HISTORY Social History   Tobacco Use  . Smoking status: Former    Current packs/day: 0.00    Types: Cigarettes    Quit date: 10/03/2015    Years since quitting: 7.3  . Smokeless tobacco: Former  Substance Use Topics  . Alcohol use: No  . Drug use: Yes    Frequency: 2.0 times per week    Types: Marijuana          OPHTHALMIC EXAM:  Base Eye Exam     Visual Acuity (Snellen - Linear)       Right Left   Dist [place] 20/100 +1 20/80 -1   Dist ph [place] 20/30 +2 20/40 +1   Near [place] J1+ J1+         Tonometry (Applanation: Fluress OU, 8:34 AM)  Right Left   Pressure 18 18         Pupils       Pupils React APD   Right PERRL Brisk None   Left PERRL Slow None         Visual Fields (Counting fingers)       Left Right    Full Full         Extraocular Movement       Right Left    Full Full         Neuro/Psych     Oriented x3: Yes   Mood/Affect: Normal         Dilation     Both eyes: 2.5% Phenylephrine, 1.0% Tropicamide @ 8:34 AM           Slit Lamp and Fundus Exam     External Exam       Right Left   External Normal Normal         Slit Lamp Exam       Right Left   Lids/Lashes Normal Normal   Conjunctiva/Sclera White and quiet White and quiet   Cornea PEK linear stromal scar, PEK   Anterior Chamber Deep and quiet Deep and quiet   Iris Dilated Dilated   Lens Clear Clear         Fundus Exam       Right Left    Posterior Vitreous Normal Normal   Disc Normal Normal   C/D Ratio 0.4 0.4   Macula Normal Normal   Vessels Normal Normal   Periphery Normal Normal           Refraction     Wearing Rx     Type: none         Manifest Refraction       Sphere Cylinder Axis Dist VA   Right -1.00 -1.00 099 20/20 -1   Left -1.00 -1.00 074 20/20         Final Rx       Sphere Cylinder Axis Dist VA   Right -1.00 -1.00 099 20/20 -1   Left -1.00 -1.00 074 20/20    Expiration Date: 02/14/2024  Remark: UV filter, Polycarbonate, Tint of choice Doctor Recommendations:Anti-Reflective   No metal frames              IMAGING AND PROCEDURES:   ASSESSMENT/PLAN:  1. Corneal opacity of left eye      2. Dry eye syndrome of both lacrimal glands      3. Myopic astigmatism, bilateral        Corneal opacity left eye  Mild, monitor  Dry eye OU  Discussed with pt today  Recommend starting AT's BID OU - order signed  Refractive error OU:  - new MRx given today   Medication ordered this visit:  No orders of the defined types were placed in this encounter.      Return if symptoms worsen or fail to improve.  Patient Instructions  Use artificial tears in both eyes twice a day  Explained the diagnoses, plan, and follow up with the patient and they expressed understanding.  Patient expressed understanding of the importance of proper follow up care.   This document serves as a record of services personally performed by Comer Jerilee Lauth, MD.  It was created on their behalf by Harlene Earnie Ming, COA, a trained medical scribe, and Certified Ophthalmic Assistant (COA). During the course of documenting the history, physical exam and medical decision  making, I was functioning as a Stage manager. The creation of this record is the provider's dictation and/or activities during the visit.  Electronically signed by Harlene Earnie Ming, COA 02/14/2023 8:54 AM   Abbreviations: M  myopia (nearsighted); A astigmatism; H hyperopia (farsighted); P presbyopia; Mrx spectacle prescription;  CTL contact lenses; OD right eye; OS left eye; OU both eyes  XT exotropia; ET esotropia; PEK punctate epithelial keratitis; PEE punctate epithelial erosions; DES dry eye syndrome; MGD meibomian gland dysfunction; ATs artificial tears; PFAT's preservative free artificial tears; NSC nuclear sclerotic cataract; PSC posterior subcapsular cataract; ERM epi-retinal membrane; PVD posterior vitreous detachment; RD retinal detachment; DM diabetes mellitus; DR diabetic retinopathy; NPDR non-proliferative diabetic retinopathy; PDR proliferative diabetic retinopathy; CSME clinically significant macular edema; DME diabetic macular edema; dbh dot blot hemorrhages; CWS cotton wool spot; POAG primary open angle glaucoma; C/D cup-to-disc ratio; HVF humphrey visual field; GVF goldmann visual field; OCT optical coherence tomography; IOP intraocular pressure; BRVO Branch retinal vein occlusion; CRVO central retinal vein occlusion; CRAO central retinal artery occlusion; BRAO branch retinal artery occlusion; RT retinal tear; SB scleral buckle; PPV pars plana vitrectomy; VH Vitreous hemorrhage; PRP panretinal laser photocoagulation; IVK intravitreal kenalog; VMT vitreomacular traction; MH Macular hole;  NVD neovascularization of the disc; NVE neovascularization elsewhere; AREDS age related eye disease study; ARMD age related macular degeneration; POAG primary open angle glaucoma; EBMD epithelial/anterior basement membrane dystrophy; ACIOL anterior chamber intraocular lens; IOL intraocular lens; PCIOL posterior chamber intraocular lens; Phaco/IOL phacoemulsification with intraocular lens placement; PRK photorefractive keratectomy; LASIK laser assisted in situ keratomileusis; HTN hypertension; DM diabetes mellitus; COPD chronic obstructive pulmonary disease   Electronically signed by: Comer Jerilee Lauth, MD 02/14/2023 10:27 AM

## 2023-09-11 ENCOUNTER — Encounter (HOSPITAL_COMMUNITY): Payer: Self-pay

## 2023-09-11 ENCOUNTER — Other Ambulatory Visit: Payer: Self-pay

## 2023-09-11 ENCOUNTER — Emergency Department (HOSPITAL_COMMUNITY)
Admission: EM | Admit: 2023-09-11 | Discharge: 2023-09-12 | Disposition: A | Discharge status: Home / Self Care | Attending: Emergency Medicine | Admitting: Emergency Medicine

## 2023-09-11 DIAGNOSIS — Z652 Problems related to release from prison: Secondary | ICD-10-CM | POA: Diagnosis not present

## 2023-09-11 DIAGNOSIS — F22 Delusional disorders: Secondary | ICD-10-CM | POA: Diagnosis present

## 2023-09-11 DIAGNOSIS — E876 Hypokalemia: Secondary | ICD-10-CM | POA: Insufficient documentation

## 2023-09-11 DIAGNOSIS — Z79899 Other long term (current) drug therapy: Secondary | ICD-10-CM | POA: Insufficient documentation

## 2023-09-11 DIAGNOSIS — R4689 Other symptoms and signs involving appearance and behavior: Secondary | ICD-10-CM | POA: Diagnosis present

## 2023-09-11 DIAGNOSIS — Z046 Encounter for general psychiatric examination, requested by authority: Secondary | ICD-10-CM

## 2023-09-11 DIAGNOSIS — F69 Unspecified disorder of adult personality and behavior: Secondary | ICD-10-CM | POA: Diagnosis present

## 2023-09-11 LAB — CBC WITH DIFFERENTIAL/PLATELET
Abs Immature Granulocytes: 0.03 K/uL (ref 0.00–0.07)
Basophils Absolute: 0 K/uL (ref 0.0–0.1)
Basophils Relative: 1 %
Eosinophils Absolute: 0 K/uL (ref 0.0–0.5)
Eosinophils Relative: 0 %
HCT: 41.9 % (ref 36.0–46.0)
Hemoglobin: 14.3 g/dL (ref 12.0–15.0)
Immature Granulocytes: 0 %
Lymphocytes Relative: 15 %
Lymphs Abs: 1.3 K/uL (ref 0.7–4.0)
MCH: 32.5 pg (ref 26.0–34.0)
MCHC: 34.1 g/dL (ref 30.0–36.0)
MCV: 95.2 fL (ref 80.0–100.0)
Monocytes Absolute: 0.7 K/uL (ref 0.1–1.0)
Monocytes Relative: 8 %
Neutro Abs: 6.7 K/uL (ref 1.7–7.7)
Neutrophils Relative %: 76 %
Platelets: UNDETERMINED K/uL (ref 150–400)
RBC: 4.4 MIL/uL (ref 3.87–5.11)
RDW: 12 % (ref 11.5–15.5)
WBC: 8.8 K/uL (ref 4.0–10.5)
nRBC: 0 % (ref 0.0–0.2)

## 2023-09-11 LAB — COMPREHENSIVE METABOLIC PANEL WITH GFR
ALT: 15 U/L (ref 0–44)
AST: 21 U/L (ref 15–41)
Albumin: 4.1 g/dL (ref 3.5–5.0)
Alkaline Phosphatase: 50 U/L (ref 38–126)
Anion gap: 11 (ref 5–15)
BUN: 15 mg/dL (ref 6–20)
CO2: 20 mmol/L — ABNORMAL LOW (ref 22–32)
Calcium: 9.3 mg/dL (ref 8.9–10.3)
Chloride: 105 mmol/L (ref 98–111)
Creatinine, Ser: 0.83 mg/dL (ref 0.44–1.00)
GFR, Estimated: >60 mL/min (ref >60)
Glucose, Bld: 114 mg/dL — ABNORMAL HIGH (ref 70–99)
Potassium: 3.4 mmol/L — ABNORMAL LOW (ref 3.5–5.1)
Sodium: 136 mmol/L (ref 135–145)
Total Bilirubin: 1.6 mg/dL — ABNORMAL HIGH (ref 0.0–1.2)
Total Protein: 7.1 g/dL (ref 6.5–8.1)

## 2023-09-11 LAB — RAPID URINE DRUG SCREEN, HOSP PERFORMED
Amphetamines: NOT DETECTED
Barbiturates: NOT DETECTED
Benzodiazepines: NOT DETECTED
Cocaine: NOT DETECTED
Opiates: NOT DETECTED
Tetrahydrocannabinol: NOT DETECTED

## 2023-09-11 LAB — ETHANOL: Alcohol, Ethyl (B): <15 mg/dL (ref <15)

## 2023-09-11 LAB — HCG, SERUM, QUALITATIVE: Preg, Serum: NEGATIVE

## 2023-09-11 MED ORDER — FLUCONAZOLE 200 MG PO TABS
200.0000 mg | ORAL_TABLET | Freq: Once | ORAL | Status: AC
  Administered 2023-09-11: 200 mg via ORAL
  Filled 2023-09-11: qty 1

## 2023-09-11 NOTE — ED Provider Triage Note (Cosign Needed Addendum)
 Emergency Medicine Provider Triage Evaluation Note  Amanda Chang , a 38 y.o. female  was evaluated in triage.  Pt complains of IVC. Pt has 10 counts of threatening of legal officer but judge felt pt needs a psych eval to ensure she is competent to stand trial.  IVC paper by [employer].  Pt without SI/HI or AVH  Review of Systems  Positive: As above Negative: As above  Physical Exam  BP (!) 135/101   Pulse (!) 110   Temp 98.7 F (37.1 C)   Resp 18   Ht 5' 2 (1.575 m)   Wt 77.1 kg   LMP 08/25/2023   SpO2 92%   BMI 31.09 kg/m  Gen:   Awake, no distress   Resp:  Normal effort  MSK:   Moves extremities without difficulty  Other:    Medical Decision Making  Medically screening exam initiated at 6:48 PM.  Appropriate orders placed.  Amanda Chang was informed that the remainder of the evaluation will be completed by another provider, this initial triage assessment does not replace that evaluation, and the importance of remaining in the ED until their evaluation is complete.     Amanda Colon, PA-C 09/11/23 1851  ADDENDUM: I was informed that pt does not have 10 counts of threatening of legal officers and her charges were dropped today.  [employer] were instructed to transfer patient to [facility] for management, not to Amanda Chang for pretrial competency assessment. I have requested our staff to held facilitate this transfer as previously arranged.     Amanda Colon, PA-C 09/11/23 2012

## 2023-09-11 NOTE — ED Provider Notes (Cosign Needed Addendum)
 [place] EMERGENCY DEPARTMENT AT [facility] Provider Note   CSN: [id number] Arrival date & time: 09/11/23  1807     Patient presents with: Psychiatric Evaluation   Amanda Chang is a 38 y.o. female.   38 year old female presents today for IVC evaluation.  She states that she was in prison for the past couple years and has had not had a trial and needs to undergo pretrial competency assessment.  She also has concern for yeast infection.  She states she is having some discharge which is consistent with her yeast infection.  She states she was previously given a fluconazole  dose which improved the symptoms a couple weeks ago but her symptoms have returned.  She denies any other complaints.  No dysuria or other complaints.  She denies SI, HI, or AVH.  The history is provided by the patient. No language interpreter was used.       Prior to Admission medications   Medication Sig Start Date End Date Taking? Authorizing Provider  medroxyPROGESTERone  (DEPO-PROVERA ) 150 MG/ML injection Inject 150 mg into the muscle every 3 (three) months.    [provider]  ondansetron  (ZOFRAN ) 4 MG tablet Take 1 tablet by mouth every 6 hours as needed 06/13/21     oxyCODONE  (OXY IR/ROXICODONE ) 5 MG immediate release tablet Take 1 tablet by mouth every 6 hours as needed 06/30/21     tamsulosin  (FLOMAX ) 0.4 MG CAPS capsule Take 1 capsule (0.4 mg total) by mouth daily. 06/07/21   Amanda Charlie RAMAN, MD  tiZANidine  (ZANAFLEX ) 4 MG capsule Take 4 mg by mouth in the morning and at bedtime.    [provider]    Allergies: Acetaminophen  and Belladonna alkaloids    Review of Systems  Constitutional:  Negative for fever.  Gastrointestinal:  Negative for abdominal pain.  Genitourinary:  Positive for vaginal discharge. Negative for dysuria.  Psychiatric/Behavioral:  Negative for suicidal ideas.   All other systems reviewed and are negative.   Updated Vital Signs BP (!)  135/101   Pulse (!) 110   Temp 98.7 F (37.1 C)   Resp 18   Ht 5' 2 (1.575 m)   Wt 77.1 kg   LMP 08/25/2023   SpO2 92%   BMI 31.09 kg/m   Physical Exam Vitals and nursing note reviewed.  Constitutional:      General: She is not in acute distress.    Appearance: Normal appearance. She is not ill-appearing.  HENT:     Head: Normocephalic and atraumatic.     Nose: Nose normal.  Eyes:     Conjunctiva/sclera: Conjunctivae normal.  Cardiovascular:     Rate and Rhythm: Normal rate and regular rhythm.     Comments: Initially tachycardic but normal rate on my exam Pulmonary:     Effort: Pulmonary effort is normal. No respiratory distress.  Musculoskeletal:        General: No deformity. Normal range of motion.     Cervical back: Normal range of motion.  Skin:    Findings: No rash.  Neurological:     Mental Status: She is alert.     (all labs ordered are listed, but only abnormal results are displayed) Labs Reviewed  COMPREHENSIVE METABOLIC PANEL WITH GFR - Abnormal; Notable for the following components:      Result Value   Potassium 3.4 (*)    CO2 20 (*)    Glucose, Bld 114 (*)    Total Bilirubin 1.6 (*)    All other  components within normal limits  ETHANOL  RAPID URINE DRUG SCREEN, HOSP PERFORMED  CBC WITH DIFFERENTIAL/PLATELET  HCG, SERUM, QUALITATIVE    EKG: None  Radiology: No results found.   Procedures   Medications Ordered in the ED  fluconazole  (DIFLUCAN ) tablet 200 mg (has no administration in time range)                                    Medical Decision Making Risk Prescription drug management.   38 year old female presents today for concern of pretrial competency.  She presents under IVC. Her CBC is unremarkable, CMP shows potassium of 3.4, glucose 114 otherwise without acute concern.  Pregnancy test is negative.  UDS unremarkable, ethanol level less than 15. She is without SI, HI, or AVH. Patient was supposed to go directly to  behavioral health urgent care however she ended up in the emergency department.  There was a discussion had between my attending Dr. Jerrol and the psych team.  Decision was made that since patient had arrived to the emergency department and was under the care of the emergency department team that she will undergo the medical and psych evaluation in the emergency department.  She has been medically cleared.  Will place TTS consult. First exam to be done in the morning by psychiatry.  Final diagnoses:  Involuntary commitment    ED Discharge Orders     None          Hildegard Loge, PA-C 09/11/23 2156    Hildegard Loge, PA-C 09/11/23 2156    Jerrol Agent, MD 09/12/23 1118

## 2023-09-11 NOTE — ED Notes (Signed)
 [employer] has been called to transfer patient to [facility] ASAP; Charge and Primary RN made aware-Monique,RN

## 2023-09-11 NOTE — ED Triage Notes (Signed)
 [place] police brought pt here for a psych evaluation under IVC. HX of ADHD, anxiety. Denies HI and SI. Axox4.

## 2023-09-12 ENCOUNTER — Encounter (HOSPITAL_COMMUNITY): Payer: Self-pay | Admitting: Psychiatry

## 2023-09-12 DIAGNOSIS — R4689 Other symptoms and signs involving appearance and behavior: Secondary | ICD-10-CM

## 2023-09-12 DIAGNOSIS — F69 Unspecified disorder of adult personality and behavior: Secondary | ICD-10-CM | POA: Diagnosis present

## 2023-09-12 MED ORDER — CARMEX CLASSIC LIP BALM EX OINT
1.0000 | TOPICAL_OINTMENT | Freq: Once | CUTANEOUS | Status: AC
  Administered 2023-09-12: 1 via TOPICAL
  Filled 2023-09-12: qty 10

## 2023-09-12 MED ORDER — LORATADINE 10 MG PO TABS
10.0000 mg | ORAL_TABLET | Freq: Every day | ORAL | Status: DC | PRN
  Administered 2023-09-12: 10 mg via ORAL
  Filled 2023-09-12: qty 1

## 2023-09-12 NOTE — ED Provider Notes (Addendum)
 Emergency Medicine Observation Re-evaluation Note  Amanda Chang is a 38 y.o. female, seen on rounds today.  Pt initially presented to the ED for complaints of Psychiatric Evaluation Currently, the patient is awake and alert.  Pt brought in last night by the police under IVC.  She feels well this am.  Physical Exam  BP (!) 128/103 (BP Location: Right Arm)   Pulse 86   Temp 98.2 F (36.8 C) (Oral)   Resp 16   Ht 5' 2 (1.575 m)   Wt 77.1 kg   LMP 08/25/2023   SpO2 100%   BMI 31.09 kg/m  Physical Exam General: awake and alert Cardiac: rr Lungs: clear Psych: calm  ED Course / MDM  EKG:   I have reviewed the labs performed to date as well as medications administered while in observation.  Recent changes in the last 24 hours include medical clearance.  Plan  Current plan is for TTS eval.    Dean Clarity, MD 09/12/23 [id number]    Dean Clarity, MD 09/12/23 [phone]  Pt has been psych cleared.  Pt's father will take her home.   Dean Clarity, MD 09/12/23 1239
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
## 2023-09-12 NOTE — ED Notes (Signed)
IVC rescind
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
## 2023-09-12 NOTE — Discharge Instructions (Signed)
 Recommend strict adherence to safety plan created today listed below Recommend close outpatient follow-up with the [facility] behavioral health outpatient center  Safety Plan Amanda Chang will reach out to Signe Neas, (Father), call 911 or call mobile crisis, or go to nearest emergency room if concerns for safety become appreciable. Patients' will follow up with [facility] for outpatient psychiatric services (therapy/medication management).  The suicide prevention education provided includes the following: Suicide risk factors Suicide prevention and interventions National Suicide Hotline telephone number [facility] assessment telephone number [facility] Emergency Assistance 911 [facility] and/or Residential Mobile Crisis Unit telephone number Request made of family/significant other to:  Signe Neas, (Father) Remove weapons (e.g., guns, rifles, knives), all items previously/currently identified as safety concern.   Remove drugs/medications (over the counter, prescriptions, illicit drugs), all items previously/currently identified as a safety concern.

## 2023-10-09 ENCOUNTER — Other Ambulatory Visit: Payer: Self-pay

## 2023-10-09 ENCOUNTER — Encounter (HOSPITAL_BASED_OUTPATIENT_CLINIC_OR_DEPARTMENT_OTHER): Payer: Self-pay

## 2023-10-09 ENCOUNTER — Emergency Department (HOSPITAL_BASED_OUTPATIENT_CLINIC_OR_DEPARTMENT_OTHER)
Admission: EM | Admit: 2023-10-09 | Discharge: 2023-10-09 | Disposition: A | Discharge status: Home / Self Care | Attending: Emergency Medicine | Admitting: Emergency Medicine

## 2023-10-09 DIAGNOSIS — R300 Dysuria: Secondary | ICD-10-CM | POA: Diagnosis present

## 2023-10-09 DIAGNOSIS — N3090 Cystitis, unspecified without hematuria: Secondary | ICD-10-CM | POA: Insufficient documentation

## 2023-10-09 LAB — URINALYSIS, ROUTINE W REFLEX MICROSCOPIC
Bilirubin Urine: NEGATIVE
Glucose, UA: NEGATIVE mg/dL
Ketones, ur: NEGATIVE mg/dL
Leukocytes,Ua: NEGATIVE
Nitrite: POSITIVE — AB
Protein, ur: 30 mg/dL — AB
Specific Gravity, Urine: >=1.03 (ref 1.005–1.030)
pH: 5.5 (ref 5.0–8.0)

## 2023-10-09 LAB — URINALYSIS, MICROSCOPIC (REFLEX)

## 2023-10-09 LAB — PREGNANCY, URINE: Preg Test, Ur: NEGATIVE

## 2023-10-09 MED ORDER — PHENAZOPYRIDINE HCL 200 MG PO TABS: 200.0000 mg | ORAL_TABLET | Freq: Three times a day (TID) | ORAL | 0 refills | Status: DC

## 2023-10-09 MED ORDER — CEFPODOXIME PROXETIL 200 MG PO TABS: 200.0000 mg | ORAL_TABLET | Freq: Two times a day (BID) | ORAL | 0 refills | Status: DC

## 2023-10-09 NOTE — ED Triage Notes (Addendum)
 Pt reports burning around urethra 3 days ago. Not always in relation to urination . Completed monistat x 7 days. Denies vaginal discharge. Pt has been taking AZO

## 2023-10-09 NOTE — Discharge Instructions (Signed)
 I have sent you antibiotics and Pyridium .  Pyridium  is the prescription strength of Azo which should help you.  Antibiotics will also take away a lot of the burning.  Please take as prescribed.  Please drink plenty of fluids.  You may return to the emergency department for any worsening symptoms.  Otherwise, follow-up with your primary care doctor.

## 2023-10-09 NOTE — ED Notes (Signed)
 Per Pt. She started having issues with uti 3 days ago.  Pt. Reports no blood in urine.
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
## 2023-10-10 LAB — URINE CULTURE: Culture: <10000 — AB

## 2023-10-23 ENCOUNTER — Emergency Department (HOSPITAL_BASED_OUTPATIENT_CLINIC_OR_DEPARTMENT_OTHER)
Admission: EM | Admit: 2023-10-23 | Discharge: 2023-10-23 | Discharge status: Against Medical Advice | Attending: Emergency Medicine | Admitting: Emergency Medicine

## 2023-10-23 ENCOUNTER — Encounter (HOSPITAL_BASED_OUTPATIENT_CLINIC_OR_DEPARTMENT_OTHER): Payer: Self-pay

## 2023-10-23 ENCOUNTER — Other Ambulatory Visit: Payer: Self-pay

## 2023-10-23 DIAGNOSIS — Z5321 Procedure and treatment not carried out due to patient leaving prior to being seen by health care provider: Secondary | ICD-10-CM | POA: Insufficient documentation

## 2023-10-23 DIAGNOSIS — R1110 Vomiting, unspecified: Secondary | ICD-10-CM | POA: Insufficient documentation

## 2023-10-23 NOTE — ED Triage Notes (Addendum)
 Pt states she was spraying a spider with some hot shot when she breathed it in and began gagging and then vomiting. States she does not know how much she breathed in but I sprayed that spider forever. Pt also now has sore throat, left forearm began feeling numb and now just feels funny. Denies palpitations or pain through arm. Throat also sore and feels dizzy. This happened at around 0940 this am.

## 2023-12-21 ENCOUNTER — Emergency Department (HOSPITAL_BASED_OUTPATIENT_CLINIC_OR_DEPARTMENT_OTHER)

## 2023-12-21 ENCOUNTER — Encounter (HOSPITAL_BASED_OUTPATIENT_CLINIC_OR_DEPARTMENT_OTHER): Payer: Self-pay

## 2023-12-21 ENCOUNTER — Emergency Department (HOSPITAL_BASED_OUTPATIENT_CLINIC_OR_DEPARTMENT_OTHER)
Admission: EM | Admit: 2023-12-21 | Discharge: 2023-12-21 | Disposition: A | Discharge status: Home / Self Care | Attending: Emergency Medicine | Admitting: Emergency Medicine

## 2023-12-21 ENCOUNTER — Other Ambulatory Visit: Payer: Self-pay

## 2023-12-21 DIAGNOSIS — R10A2 Flank pain, left side: Secondary | ICD-10-CM

## 2023-12-21 DIAGNOSIS — N202 Calculus of kidney with calculus of ureter: Secondary | ICD-10-CM | POA: Insufficient documentation

## 2023-12-21 DIAGNOSIS — N201 Calculus of ureter: Secondary | ICD-10-CM

## 2023-12-21 LAB — CBC
HCT: 40.7 % (ref 36.0–46.0)
Hemoglobin: 14.6 g/dL (ref 12.0–15.0)
MCH: 32.4 pg (ref 26.0–34.0)
MCHC: 35.9 g/dL (ref 30.0–36.0)
MCV: 90.2 fL (ref 80.0–100.0)
Platelets: 246 K/uL (ref 150–400)
RBC: 4.51 MIL/uL (ref 3.87–5.11)
RDW: 12.1 % (ref 11.5–15.5)
WBC: 6.4 K/uL (ref 4.0–10.5)
nRBC: 0 % (ref 0.0–0.2)

## 2023-12-21 LAB — HCG, SERUM, QUALITATIVE: Preg, Serum: NEGATIVE

## 2023-12-21 LAB — URINALYSIS, ROUTINE W REFLEX MICROSCOPIC
Bilirubin Urine: NEGATIVE
Glucose, UA: NEGATIVE mg/dL
Ketones, ur: NEGATIVE mg/dL
Leukocytes,Ua: NEGATIVE
Nitrite: NEGATIVE
Protein, ur: 100 mg/dL — AB
Specific Gravity, Urine: 1.03 (ref 1.005–1.030)
pH: 6 (ref 5.0–8.0)

## 2023-12-21 LAB — URINALYSIS, MICROSCOPIC (REFLEX)

## 2023-12-21 LAB — BASIC METABOLIC PANEL WITH GFR
Anion gap: 12 (ref 5–15)
BUN: 14 mg/dL (ref 6–20)
CO2: 20 mmol/L — ABNORMAL LOW (ref 22–32)
Calcium: 8.9 mg/dL (ref 8.9–10.3)
Chloride: 106 mmol/L (ref 98–111)
Creatinine, Ser: 0.88 mg/dL (ref 0.44–1.00)
GFR, Estimated: >60 mL/min (ref >60)
Glucose, Bld: 113 mg/dL — ABNORMAL HIGH (ref 70–99)
Potassium: 3.2 mmol/L — ABNORMAL LOW (ref 3.5–5.1)
Sodium: 138 mmol/L (ref 135–145)

## 2023-12-21 MED ORDER — HYDROMORPHONE HCL 1 MG/ML IJ SOLN
1.0000 mg | Freq: Once | INTRAMUSCULAR | Status: AC
  Administered 2023-12-21: 1 mg via INTRAVENOUS
  Filled 2023-12-21: qty 1

## 2023-12-21 MED ORDER — KETOROLAC TROMETHAMINE 30 MG/ML IJ SOLN
30.0000 mg | Freq: Once | INTRAMUSCULAR | Status: AC
  Administered 2023-12-21: 30 mg via INTRAVENOUS
  Filled 2023-12-21: qty 1

## 2023-12-21 MED ORDER — ONDANSETRON HCL 4 MG PO TABS: ORAL_TABLET | ORAL | 1 refills | Status: DC

## 2023-12-21 MED ORDER — OXYCODONE HCL 5 MG PO TABS: 5.0000 mg | ORAL_TABLET | Freq: Four times a day (QID) | ORAL | 0 refills | Status: DC | PRN

## 2023-12-21 MED ORDER — ONDANSETRON HCL 4 MG/2ML IJ SOLN
4.0000 mg | Freq: Once | INTRAMUSCULAR | Status: AC
  Administered 2023-12-21: 4 mg via INTRAVENOUS
  Filled 2023-12-21: qty 2

## 2023-12-21 MED ORDER — TAMSULOSIN HCL 0.4 MG PO CAPS: 0.4000 mg | ORAL_CAPSULE | Freq: Every day | ORAL | 0 refills | Status: AC

## 2023-12-21 NOTE — ED Notes (Signed)
 Pt requested to stay in bed until her ride was able to pick her up.

## 2023-12-21 NOTE — ED Triage Notes (Signed)
 Pt complaining of left sided flank pain that started 30 min ago. Is nauseous and had some blood in her urine yesterday. Hx of kidney stones.

## 2023-12-21 NOTE — ED Provider Notes (Signed)
 Pt signed out by Dr. Haze pending CT scan and symptomatic improvement.  CT scan reviewed by me.  I agree with the radiologist.  CT renal: 1. Acute obstructive uropathy on the left due to a 4 mm proximal ureteral  stone, 23 cm from the UPJ.  2. Additional punctate bilateral nephrolithiasis.   Pt is feeling better, but still has some pain.  Additional pain meds given.  Pt is stable for d/c home.  She is to f/u with urology.  Return if worse.   Dean Clarity, MD 12/21/23 [phone]

## 2023-12-21 NOTE — ED Provider Notes (Signed)
 [place] EMERGENCY DEPARTMENT AT [facility] Provider Note   CSN: [id number] Arrival date & time: 12/21/23  9448     Patient presents with: Abdominal Pain   Amanda Chang is a 38 y.o. female.   Presents to the emergency department for evaluation of left flank pain.  Patient reports that the pain started suddenly 30 minutes ago.  Patient reports that she is experiencing some nausea but no vomiting.  Patient reports prior history of kidney stones and this feels similar.  She did notice some blood in her urine yesterday.       Prior to Admission medications   Medication Sig Start Date End Date Taking? Authorizing Provider  cefpodoxime  (VANTIN ) 200 MG tablet Take 1 tablet (200 mg total) by mouth 2 (two) times daily. 10/09/23   Theotis Peers M, PA-C  medroxyPROGESTERone  (DEPO-PROVERA ) 150 MG/ML injection Inject 150 mg into the muscle every 3 (three) months.    [provider]  ondansetron  (ZOFRAN ) 4 MG tablet Take 1 tablet by mouth every 6 hours as needed 06/13/21     oxyCODONE  (OXY IR/ROXICODONE ) 5 MG immediate release tablet Take 1 tablet by mouth every 6 hours as needed 06/30/21     phenazopyridine  (PYRIDIUM ) 200 MG tablet Take 1 tablet (200 mg total) by mouth 3 (three) times daily. 10/09/23   Theotis Peers M, PA-C  tamsulosin  (FLOMAX ) 0.4 MG CAPS capsule Take 1 capsule (0.4 mg total) by mouth daily. 06/07/21   Schuyler Charlie RAMAN, MD  tiZANidine  (ZANAFLEX ) 4 MG capsule Take 4 mg by mouth in the morning and at bedtime.    [provider]    Allergies: Acetaminophen  and Belladonna alkaloids    Review of Systems  Updated Vital Signs BP (!) 155/108 (BP Location: Left Arm)   Pulse 76   Temp 98.2 F (36.8 C) (Oral)   Ht 5' 2 (1.575 m)   Wt 79.4 kg   SpO2 99%   BMI 32.01 kg/m   Physical Exam Vitals and nursing note reviewed.  Constitutional:      General: She is in acute distress.     Appearance: She is well-developed.  HENT:      Head: Normocephalic and atraumatic.     Mouth/Throat:     Mouth: Mucous membranes are moist.  Eyes:     General: Vision grossly intact. Gaze aligned appropriately.     Extraocular Movements: Extraocular movements intact.     Conjunctiva/sclera: Conjunctivae normal.  Cardiovascular:     Rate and Rhythm: Normal rate and regular rhythm.     Pulses: Normal pulses.     Heart sounds: Normal heart sounds, S1 normal and S2 normal. No murmur heard.    No friction rub. No gallop.  Pulmonary:     Effort: Pulmonary effort is normal. No respiratory distress.     Breath sounds: Normal breath sounds.  Abdominal:     General: Bowel sounds are normal.     Palpations: Abdomen is soft.     Tenderness: There is no abdominal tenderness. There is left CVA tenderness. There is no guarding or rebound.     Hernia: No hernia is present.  Musculoskeletal:        General: No swelling.     Cervical back: Full passive range of motion without pain, normal range of motion and neck supple. No spinous process tenderness or muscular tenderness. Normal range of motion.     Right lower leg: No edema.     Left lower leg: No  edema.  Skin:    General: Skin is warm and dry.     Capillary Refill: Capillary refill takes less than 2 seconds.     Findings: No ecchymosis, erythema, rash or wound.  Neurological:     General: No focal deficit present.     Mental Status: She is alert and oriented to person, place, and time.     GCS: GCS eye subscore is 4. GCS verbal subscore is 5. GCS motor subscore is 6.     Cranial Nerves: Cranial nerves 2-12 are intact.     Sensory: Sensation is intact.     Motor: Motor function is intact.     Coordination: Coordination is intact.  Psychiatric:        Attention and Perception: Attention normal.        Mood and Affect: Mood normal.        Speech: Speech normal.        Behavior: Behavior normal.     (all labs ordered are listed, but only abnormal results are displayed) Labs Reviewed   CBC  BASIC METABOLIC PANEL WITH GFR  URINALYSIS, ROUTINE W REFLEX MICROSCOPIC  HCG, SERUM, QUALITATIVE    EKG: None  Radiology: No results found.   Procedures   Medications Ordered in the ED  HYDROmorphone  (DILAUDID ) injection 1 mg (1 mg Intravenous Given 12/21/23 0617)  ondansetron  (ZOFRAN ) injection 4 mg (4 mg Intravenous Given 12/21/23 0617)                                    Medical Decision Making Amount and/or Complexity of Data Reviewed Labs: ordered.  Risk Prescription drug management.   Differential Diagnosis considered includes, but not limited to: Renal colic/kidney stone; pyelonephritis; aortic dissection; musculoskeletal pain.  Presents with recurrent flank pain.  Patient reports a history of kidney stones with similar pains in the past.  Patient administered analgesia, she is feeling better.  Lab work is still pending.  Will sign out to oncoming ER physician to follow-up these results.     Final diagnoses:  Left flank pain    ED Discharge Orders     None          Haze Lonni PARAS, MD 12/21/23 [phone]

## 2023-12-31 ENCOUNTER — Other Ambulatory Visit: Payer: Self-pay | Admitting: Urology

## 2024-01-02 NOTE — Patient Instructions (Signed)
 SURGICAL WAITING ROOM VISITATION Patients having surgery or a procedure may have no more than 2 support people in the waiting area - these visitors may rotate in the visitor waiting room.   If the patient needs to stay at the hospital during part of their recovery, the visitor guidelines for inpatient rooms apply.  PRE-OP VISITATION  Pre-op nurse will coordinate an appropriate time for 1 support person to accompany the patient in pre-op.  This support person may not rotate.  This visitor will be contacted when the time is appropriate for the visitor to come back in the pre-op area.  To keep our patients, visitors and teammates safe and prevent the spread of respiratory illnesses over the next few months.  Temporary Visitor Restrictions  Children ages 61 and under will not be able to visit patients in [facility] under most circumstances. Visitation is not restricted outside of hospitals unless noted otherwise in the [facility] and Location Specific Visitation Guidelines at:       [email or web address]  Visitors with respiratory illnesses are discouraged from visiting and should remain at home. You are not required to quarantine at this time prior to your surgery. However, you must do this: Hand Hygiene often Do NOT share personal items Notify your provider if you are in close contact with someone who has COVID or you develop fever 100.4 or greater, new onset of sneezing, cough, sore throat, shortness of breath or body aches.  If you test positive for Covid or have been in contact with anyone that has tested positive in the last 10 days please notify you surgeon.    Your procedure is scheduled on:  Monday  01-07-2024  Report to [facility] Main Entrance: [address] entrance where the [employer] is available.   Report to admitting at: 12:00 noon  Call this number if you have any questions or problems the morning of surgery [phone]  DO NOT EAT OR DRINK ANYTHING AFTER  MIDNIGHT THE NIGHT PRIOR TO YOUR SURGERY / PROCEDURE.   FOLLOW  ANY ADDITIONAL PRE OP INSTRUCTIONS YOU RECEIVED FROM YOUR SURGEON'S OFFICE!!!   Oral Hygiene is also important to reduce your risk of infection.        Remember - BRUSH YOUR TEETH THE MORNING OF SURGERY WITH YOUR REGULAR TOOTHPASTE  Do NOT smoke after Midnight the night before surgery.  STOP TAKING all Vitamins, Herbs and supplements 1 week before your surgery.   Take ONLY these medicines the morning of surgery with A SIP OF WATER: amsulosin,  terbinafine (Lamisil), levocetirizine (Xyzal),  and Oxycodone  IR if needed, You may use your nasal spray if needed.                    You may not have any metal on your body including hair pins, jewelry, and body piercing  Do not wear make-up, lotions, powders, perfumes or deodorant  Do not wear nail polish including gel and S&S, artificial / acrylic nails, or any other type of covering on natural nails including finger and toenails. If you have artificial nails, gel coating, etc., that needs to be removed by a nail salon, Please have this removed prior to surgery. Not doing so may mean that your surgery could be cancelled or delayed if the Surgeon or anesthesia staff feels like they are unable to monitor you safely.   Do not shave 48 hours prior to surgery to avoid nicks in your skin which may contribute to postoperative infections.  Contacts, Hearing Aids, dentures or bridgework may not be worn into surgery. DENTURES WILL BE REMOVED PRIOR TO SURGERY PLEASE DO NOT APPLY Poly grip OR ADHESIVES!!!  Patients discharged on the day of surgery will not be allowed to drive home.  Someone NEEDS to stay with you for the first 24 hours after anesthesia.  Do not bring your home medications to the hospital. The Pharmacy will dispense medications listed on your medication list to you during your admission in the Hospital.  Please read over the following fact sheets you were given: IF YOU HAVE  QUESTIONS ABOUT YOUR PRE-OP INSTRUCTIONS, PLEASE CALL [phone].      [place] - Preparing for Surgery Before surgery, you can play an important role.  Because skin is not sterile, your skin needs to be as free of germs as possible.  You can reduce the number of germs on your skin by washing with Antibacterial soap before surgery.  . Do not shave (including legs and underarms) for at least 48 hours prior to the first shower.  You may shave your face/neck.  Please follow these instructions carefully:  1.  Shower with antibacterial Soap the night before surgery and the  morning of surgery.  2.  If you choose to wash your hair, wash your hair first as usual with your normal  shampoo.  3.  After you shampoo, rinse your hair and body thoroughly to remove the shampoo.                             4.  You can apply soap directly to the skin and wash.  Gently with a scrungie or clean washcloth.  5.  Wash face,  Genitals (private parts) with your normal soap.             6.  Wash thoroughly, paying special attention to the area where your  surgery  will be performed.  7.  Thoroughly rinse your body with warm water from the neck down.  8.   Pat yourself dry with a clean towel.             9  Wear clean pajamas.            10 Place clean sheets on your bed the night of your first shower and do not  sleep with pets.  ON THE DAY OF SURGERY : Do not apply any lotions/deodorants the morning of surgery.  Please wear clean clothes to the hospital/surgery center.  FAILURE TO FOLLOW THESE INSTRUCTIONS MAY RESULT IN THE CANCELLATION OF YOUR SURGERY  PATIENT SIGNATURE_________________________________  NURSE SIGNATURE__________________________________

## 2024-01-02 NOTE — Progress Notes (Signed)
 The patient was identified using 2 approved identifiers. . The patient was emailed the surgery instructions per her request.    [email or web address] All issues noted in this document were discussed and addressed,Ms Amanda Chang voiced understanding and agreement with all preoperative instructions  The patient was instructed to call our Admitting Office [phone] or [phone]) to complete their Pre-surgical Interview.    Medication Recon: 12-31-2023  COVID Vaccine received:  [x]  No []  Yes Date of any COVID positive Test in last 90 days: none  PCP - Kasey Eke, NP at [facility] [phone] Cardiologist -  none Pain Mgmt - [facility]  Chest x-ray - 04-10-2019  1v  Epic EKG -  04-10-2019 Epic  Stress Test -  ECHO -  Cardiac Cath -  CT Coronary Calcium score:   Pacemaker / ICD device [x]  No []  Yes   Spinal Cord Stimulator:[x]  No []  Yes       History of Sleep Apnea? [x]  No []  Yes   CPAP used?- [x]  No []  Yes    Medication on DOS: Tamsulosin ,  terbinafine (Lamisil), levocetirizine (Xyzal),  Oxycodone  IR, nasal spray  Patient has: []  NO Hx DM   []  Pre-DM   []  DM1  []   DM2 Does the patient monitor blood sugar?   [x]  N/A   []  No []  Yes   Blood Thinner / Instructions: none  Aspirin  Instructions: none  Activity level: Able to walk up 2 flights of stairs without becoming significantly short of breath or having chest pain?   [x]    Yes   []  No,  would have:  Patient can perform ADLs without assistance.  [x]   Yes    []  No  Comments: Seen at [facility] on 12-21-23 had bloodwork (BMP, CBC) which we can use for her surgery.   Anesthesia review: Palps, depression, delusions (schizophrenia) Polysubstance abuse,   Patient denies any S&S of respiratory illness or Covid - no shortness of breath, fever, cough or chest pain at PAT appointment.

## 2024-01-03 ENCOUNTER — Other Ambulatory Visit: Payer: Self-pay

## 2024-01-03 ENCOUNTER — Encounter (HOSPITAL_COMMUNITY): Admission: RE | Admit: 2024-01-03 | Discharge: 2024-01-03 | Attending: Urology

## 2024-01-03 ENCOUNTER — Encounter (HOSPITAL_COMMUNITY): Payer: Self-pay

## 2024-01-03 VITALS — Ht 62.0 in | Wt 177.0 lb

## 2024-01-03 DIAGNOSIS — Z01818 Encounter for other preprocedural examination: Secondary | ICD-10-CM
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
## 2024-01-07 ENCOUNTER — Encounter: Admission: RE | Disposition: A | Payer: Self-pay | Attending: Urology

## 2024-01-07 ENCOUNTER — Encounter (HOSPITAL_COMMUNITY): Payer: Self-pay

## 2024-01-07 ENCOUNTER — Encounter (HOSPITAL_COMMUNITY): Payer: Self-pay | Admitting: Urology

## 2024-01-07 ENCOUNTER — Ambulatory Visit (HOSPITAL_COMMUNITY)
Admission: RE | Admit: 2024-01-07 | Discharge: 2024-01-07 | Disposition: A | Discharge status: Home / Self Care | Attending: Urology | Admitting: Urology

## 2024-01-07 ENCOUNTER — Ambulatory Visit (HOSPITAL_COMMUNITY): Payer: Self-pay

## 2024-01-07 ENCOUNTER — Ambulatory Visit (HOSPITAL_COMMUNITY)

## 2024-01-07 DIAGNOSIS — Z87891 Personal history of nicotine dependence: Secondary | ICD-10-CM | POA: Insufficient documentation

## 2024-01-07 DIAGNOSIS — N201 Calculus of ureter: Secondary | ICD-10-CM

## 2024-01-07 DIAGNOSIS — Z01818 Encounter for other preprocedural examination: Secondary | ICD-10-CM

## 2024-01-07 HISTORY — PX: CYSTOSCOPY W/ RETROGRADES: SHX1426

## 2024-01-07 HISTORY — PX: CYSTOSCOPY/URETEROSCOPY/HOLMIUM LASER/STENT PLACEMENT: SHX6546

## 2024-01-07 LAB — POCT PREGNANCY, URINE: Preg Test, Ur: NEGATIVE

## 2024-01-07 SURGERY — CYSTOSCOPY/URETEROSCOPY/HOLMIUM LASER/STENT PLACEMENT
Anesthesia: General | Laterality: Left

## 2024-01-07 MED ORDER — KETOROLAC TROMETHAMINE 30 MG/ML IJ SOLN
INTRAMUSCULAR | Status: AC
  Filled 2024-01-07: qty 1

## 2024-01-07 MED ORDER — ONDANSETRON HCL 4 MG/2ML IJ SOLN
INTRAMUSCULAR | Status: DC | PRN
  Administered 2024-01-07: 4 mg via INTRAVENOUS

## 2024-01-07 MED ORDER — PROPOFOL 10 MG/ML IV BOLUS
INTRAVENOUS | Status: AC
  Filled 2024-01-07: qty 20

## 2024-01-07 MED ORDER — CEFAZOLIN SODIUM-DEXTROSE 2-4 GM/100ML-% IV SOLN
2.0000 g | INTRAVENOUS | Status: AC
  Administered 2024-01-07: 2 g via INTRAVENOUS
  Filled 2024-01-07: qty 100

## 2024-01-07 MED ORDER — FENTANYL CITRATE (PF) 50 MCG/ML IJ SOSY
PREFILLED_SYRINGE | INTRAMUSCULAR | Status: AC
  Filled 2024-01-07: qty 1

## 2024-01-07 MED ORDER — DROPERIDOL 2.5 MG/ML IJ SOLN: 0.6250 mg | Freq: Once | INTRAMUSCULAR | Status: DC | PRN

## 2024-01-07 MED ORDER — LIDOCAINE HCL (CARDIAC) PF 100 MG/5ML IV SOSY
PREFILLED_SYRINGE | INTRAVENOUS | Status: DC | PRN
  Administered 2024-01-07: 100 mg via INTRAVENOUS

## 2024-01-07 MED ORDER — LACTATED RINGERS IV SOLN: INTRAVENOUS | Status: DC

## 2024-01-07 MED ORDER — ORAL CARE MOUTH RINSE: 15.0000 mL | Freq: Once | OROMUCOSAL | Status: AC

## 2024-01-07 MED ORDER — OXYCODONE HCL 5 MG PO TABS
5.0000 mg | ORAL_TABLET | Freq: Once | ORAL | Status: AC
  Administered 2024-01-07: 5 mg via ORAL

## 2024-01-07 MED ORDER — ONDANSETRON HCL 4 MG/2ML IJ SOLN
INTRAMUSCULAR | Status: AC
  Filled 2024-01-07: qty 2

## 2024-01-07 MED ORDER — LIDOCAINE HCL (PF) 2 % IJ SOLN
INTRAMUSCULAR | Status: AC
  Filled 2024-01-07: qty 5

## 2024-01-07 MED ORDER — PROPOFOL 10 MG/ML IV BOLUS
INTRAVENOUS | Status: DC | PRN
  Administered 2024-01-07: 200 mg via INTRAVENOUS

## 2024-01-07 MED ORDER — FENTANYL CITRATE (PF) 100 MCG/2ML IJ SOLN
INTRAMUSCULAR | Status: DC | PRN
  Administered 2024-01-07 (×2): 25 ug via INTRAVENOUS
  Administered 2024-01-07: 50 ug via INTRAVENOUS
  Administered 2024-01-07: 25 ug via INTRAVENOUS

## 2024-01-07 MED ORDER — FENTANYL CITRATE (PF) 100 MCG/2ML IJ SOLN
INTRAMUSCULAR | Status: AC
  Filled 2024-01-07: qty 2

## 2024-01-07 MED ORDER — MIDAZOLAM HCL 2 MG/2ML IJ SOLN
INTRAMUSCULAR | Status: AC
  Filled 2024-01-07: qty 2

## 2024-01-07 MED ORDER — OXYCODONE HCL 5 MG PO TABS
ORAL_TABLET | ORAL | Status: AC
  Filled 2024-01-07: qty 1

## 2024-01-07 MED ORDER — IOHEXOL 300 MG/ML  SOLN
INTRAMUSCULAR | Status: DC | PRN
  Administered 2024-01-07: 4 mL

## 2024-01-07 MED ORDER — OXYCODONE HCL 5 MG/5ML PO SOLN: 5.0000 mg | Freq: Once | ORAL | Status: AC | PRN

## 2024-01-07 MED ORDER — CHLORHEXIDINE GLUCONATE 0.12 % MT SOLN
15.0000 mL | Freq: Once | OROMUCOSAL | Status: AC
  Administered 2024-01-07: 15 mL via OROMUCOSAL

## 2024-01-07 MED ORDER — ONDANSETRON HCL 4 MG PO TABS: 4.0000 mg | ORAL_TABLET | Freq: Three times a day (TID) | ORAL | 0 refills | Status: AC | PRN

## 2024-01-07 MED ORDER — SODIUM CHLORIDE 0.9 % IR SOLN
Status: DC | PRN
  Administered 2024-01-07: 3000 mL via INTRAVESICAL

## 2024-01-07 MED ORDER — KETOROLAC TROMETHAMINE 30 MG/ML IJ SOLN
INTRAMUSCULAR | Status: DC | PRN
  Administered 2024-01-07: 30 mg via INTRAVENOUS

## 2024-01-07 MED ORDER — DEXMEDETOMIDINE HCL IN NACL 80 MCG/20ML IV SOLN
INTRAVENOUS | Status: DC | PRN
  Administered 2024-01-07 (×2): 8 ug via INTRAVENOUS

## 2024-01-07 MED ORDER — DEXAMETHASONE SOD PHOSPHATE PF 10 MG/ML IJ SOLN
INTRAMUSCULAR | Status: DC | PRN
  Administered 2024-01-07: 5 mg via INTRAVENOUS

## 2024-01-07 MED ORDER — OXYCODONE HCL 5 MG PO TABS: 5.0000 mg | ORAL_TABLET | Freq: Three times a day (TID) | ORAL | 0 refills | Status: DC | PRN

## 2024-01-07 MED ORDER — MIDAZOLAM HCL 5 MG/5ML IJ SOLN
INTRAMUSCULAR | Status: DC | PRN
  Administered 2024-01-07: 2 mg via INTRAVENOUS

## 2024-01-07 MED ADMIN — Fentanyl Citrate PF Soln Prefilled Syringe 50 MCG/ML: 50 ug | INTRAVENOUS | NDC 63323080611

## 2024-01-07 MED ADMIN — Oxycodone HCl Tab 5 MG: 5 mg | ORAL | NDC 00406055223

## 2024-01-07 MED ADMIN — Fentanyl Citrate PF Soln Prefilled Syringe 50 MCG/ML: 50 ug | INTRAVENOUS | NDC 63323080801

## 2024-01-07 MED FILL — Fentanyl Citrate PF Soln Prefilled Syringe 50 MCG/ML: 25.0000 ug | INTRAMUSCULAR | Qty: 1 | Status: AC

## 2024-01-07 SURGICAL SUPPLY — 24 items
BAG URO CATCHER STRL LF (MISCELLANEOUS) ×1 IMPLANT
BASKET ZERO TIP NITINOL 2.4FR (BASKET) IMPLANT
BENZOIN TINCTURE PRP APPL 2/3 (GAUZE/BANDAGES/DRESSINGS) IMPLANT
CATH URETERAL DUAL LUMEN 10F (MISCELLANEOUS) IMPLANT
CATH URETL OPEN 5X70 (CATHETERS) IMPLANT
CATH URETL OPEN END 6FR 70 (CATHETERS) IMPLANT
CLOTH BEACON ORANGE TIMEOUT ST (SAFETY) ×1 IMPLANT
DRSG TEGADERM 2-3/8X2-3/4 SM (GAUZE/BANDAGES/DRESSINGS) IMPLANT
FIBER LASER MOSES 200 DFL (Laser) IMPLANT
GLOVE BIOGEL M 7.0 STRL (GLOVE) ×1 IMPLANT
GOWN STRL REUS W/ TWL XL LVL3 (GOWN DISPOSABLE) ×1 IMPLANT
GUIDEWIRE STR DUAL SENSOR (WIRE) ×2 IMPLANT
GUIDEWIRE ZIPWRE .038 STRAIGHT (WIRE) IMPLANT
KIT TURNOVER KIT A (KITS) ×1 IMPLANT
MANIFOLD NEPTUNE II (INSTRUMENTS) ×1 IMPLANT
NS IRRIG 1000ML POUR BTL (IV SOLUTION) IMPLANT
PACK CYSTO (CUSTOM PROCEDURE TRAY) ×1 IMPLANT
PAD PREP 24X48 CUFFED NSTRL (MISCELLANEOUS) ×1 IMPLANT
SHEATH DILATOR SET 8/10 (MISCELLANEOUS) IMPLANT
SHEATH NAVIGATOR HD 11/13X28 (SHEATH) IMPLANT
SHEATH NAVIGATOR HD 11/13X36 (SHEATH) IMPLANT
STENT URET 6FRX24 CONTOUR (STENTS) IMPLANT
TUBING CONNECTING 10 (TUBING) ×1 IMPLANT
TUBING UROLOGY SET (TUBING) ×1 IMPLANT

## 2024-01-07 NOTE — Op Note (Signed)
 Operative Note  Preoperative diagnosis:  1.  Left ureteral stone  Postoperative diagnosis: 1.  Left ureteral stone  Procedure(s): 1.  Cystoscopy 2. Left ureteroscopy with laser lithotripsy and basket extraction of stones 3. Left retrograde pyelogram 4. Left ureteral stent placement 5. Fluoroscopy with intraoperative interpretation  Surgeon: Donnice Siad, MD  Assistants:  None  Anesthesia:  General  Complications:  None  EBL:  Minimal  Specimens: 1. Left stones for stone analysis (to be done at [facility])  Drains/Catheters: 1.  Left 6Fr x 24cm ureteral stent with a tether string  Intraoperative findings:   Cystoscopy demonstrated no suspicious bladder lesions. Left retrograde pyelogram with mild left hydronephrosis, no extravasation of contrast, no filling defects. Left ureteroscopy demonstrated an impacted proximal 4 mm left ureteral stone.  This was successfully fragmented and basket extracted. Successful stent placement.  Indication:  Amanda Chang is a 38 y.o. female with a 4 mm proximal left ureteral stone who is here for definitive management.  Description of procedure: After informed consent was obtained from the patient, the patient was identified and taken to the operating room and placed in the supine position.  General anesthesia was administered as well as perioperative IV antibiotics.  At the beginning of the case, a time-out was performed to properly identify the patient, the surgery to be performed, and the surgical site.  Sequential compression devices were applied to the lower extremities at the beginning of the case for DVT prophylaxis.  The patient was then placed in the dorsal lithotomy supine position, prepped and draped in sterile fashion.  We then passed the 21-French rigid cystoscope through the urethra and into the bladder under vision without any difficulty, noting a normal urethra without strictures.  A systematic evaluation of the  bladder revealed no evidence of any suspicious bladder lesions.  Ureteral orifices were in normal position.    Under cystoscopic and flouroscopic guidance, we cannulated the left ureteral orifice with a 5-French open-ended ureteral catheter and a gentle retrograde pyelogram was performed, revealing a normal caliber ureter without any filling defects. There was mild left hydronephrosis of the collecting system. A 0.038 sensor wire was then passed up to the level of the renal pelvis and secured to the drape as a safety wire. The ureteral catheter and cystoscope were removed, leaving the safety wire in place.   A semi-rigid ureteroscope was passed alongside the wire up the distal ureter which appeared normal. I navigated this to the proximal ureter but could not reach the stone. A separate wire was placed and over this wire, a flexible ureteroscope was advanced and we encountered an impacted proximal ureteral stone.  Using the 272 micron holmium laser fiber, the stone was fragmented completely. A 2.2 Fr zero tip basket was used to remove the fragments under visual guidance. These were sent for chemical analysis. With the ureteroscope in the kidney, a gentle pyelogram was performed to delineate the calyceal system and we evaluated the calyces systematically. We encountered no further stones. The calyces were re-inspected and there were no significant stone fragment residual.   We then withdrew the ureteroscope back down the ureter, noting no evidence of any stones along the course of the ureter.  Prior to removing the ureteroscope, we did pass the Glidewire back up to the ureter to the renal pelvis.  Once the ureteroscope was removed, we then used the Glidewire under fluoroscopic guidance and passed up a 6-French x 24 cm double-pigtail ureteral stent up the ureter, making sure  that the proximal and distal ends coiled within the kidney and bladder respectively.  Note that we left a  long tether string attached to  the distal end of the ureteral stent and it exited the urethral meatus and was secured to the inner thigh with a tegaderm adhesive.  The cystoscope was then advanced back into the bladder under vision.  We were able to see the distal stent coiling nicely within the bladder.  The bladder was then emptied with irrigation solution.  The cystoscope was then removed.    The patient tolerated the procedure well and there was no complication. Patient was awoken from anesthesia and taken to the recovery room in stable condition. I was present and scrubbed for the entirety of the case.  Plan:  Patient will be discharged home.  She will remove stent in 3 days.  She will follow-up in 1 month with renal ultrasound.  Matt R. Tadarius Maland MD [facility]  Pager: [phone]

## 2024-01-07 NOTE — Anesthesia Procedure Notes (Signed)
 Procedure Name: LMA Insertion Date/Time: 01/07/2024 1:20 PM  Performed by: Belvie Valri NOVAK, CRNAPre-anesthesia Checklist: Patient identified, Emergency Drugs available, Suction available and Patient being monitored Patient Re-evaluated:Patient Re-evaluated prior to induction Oxygen Delivery Method: Circle System Utilized Preoxygenation: Pre-oxygenation with 100% oxygen Induction Type: IV induction LMA: LMA inserted LMA Size: 4.0 Number of attempts: 1 Placement Confirmation: positive ETCO2 Tube secured with: Tape Dental Injury: Teeth and Oropharynx as per pre-operative assessment

## 2024-01-07 NOTE — Discharge Instructions (Signed)
 [facility] [phone] Post Ureteroscopy With or Without Stent Instructions  Definitions:  Ureter: The duct that transports urine from the kidney to the bladder. Stent:   A plastic hollow tube that is placed into the ureter, from the kidney to the bladder to prevent the ureter from swelling shut.  GENERAL INSTRUCTIONS:  Despite the fact that no skin incisions were used, the area around the ureter and bladder is raw and irritated. The stent is a foreign body which will further irritate the bladder wall. This irritation is manifested by increased frequency of urination, both day and night, and by an increase in the urge to urinate. In some, the urge to urinate is present almost always. Sometimes the urge is strong enough that you may not be able to stop yourself from urinating. The only real cure is to remove the stent and then give time for the bladder wall to heal which can't be done until the danger of the ureter swelling shut has passed, which varies.  You may see some blood in your urine while the stent is in place and a few days afterwards. Do not be alarmed, even if the urine was clear for a while. Get off your feet and drink lots of fluids until clearing occurs. If you start to pass clots or don't improve, call us .  DIET: You may return to your normal diet immediately. Because of the raw surface of your bladder, alcohol, spicy foods, acid type foods and drinks with caffeine may cause irritation or frequency and should be used in moderation. To keep your urine flowing freely and to avoid constipation, drink plenty of fluids during the day ( 8-10 glasses ). Tip: Avoid cranberry juice because it is very acidic.  ACTIVITY: Your physical activity doesn't need to be restricted. However, if you are very active, you may see some blood in your urine. We suggest that you reduce your activity under these circumstances until the bleeding has stopped.  BOWELS: It is important to  keep your bowels regular during the postoperative period. Straining with bowel movements can cause bleeding. A bowel movement every other day is reasonable. Use a mild laxative if needed, such as Milk of Magnesia 2-3 tablespoons, or 2 Dulcolax tablets. Call if you continue to have problems. If you have been taking narcotics for pain, before, during or after your surgery, you may be constipated. Take a laxative if necessary.   MEDICATION: You should resume your pre-surgery medications unless told not to. In addition you will often be given an antibiotic to prevent infection. These should be taken as prescribed until the bottles are finished unless you are having an unusual reaction to one of the drugs.  PROBLEMS YOU SHOULD REPORT TO US : Fevers over 100.5 Fahrenheit. Heavy bleeding, or clots ( See above notes about blood in urine ). Inability to urinate. Drug reactions ( hives, rash, nausea, vomiting, diarrhea ). Severe burning or pain with urination that is not improving.  FOLLOW-UP: You will need a follow-up appointment to monitor your progress. Call for this appointment at the number listed above. Usually the first appointment will be about three to fourteen days after your surgery.  You have a stent draining your kidney and this will be removed on Friday AM.
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
## 2024-01-07 NOTE — Anesthesia Postprocedure Evaluation (Signed)
"   Anesthesia Post Note  Patient: Amanda Chang  Procedure(s) Performed: CYSTOSCOPY/URETEROSCOPY/HOLMIUM LASER/STENT PLACEMENT (Left) CYSTOSCOPY, WITH RETROGRADE PYELOGRAM (Left)     Patient location during evaluation: PACU Anesthesia Type: General Level of consciousness: awake and alert Pain management: pain level controlled Vital Signs Assessment: post-procedure vital signs reviewed and stable Respiratory status: spontaneous breathing, nonlabored ventilation, respiratory function stable and patient connected to nasal cannula oxygen Cardiovascular status: blood pressure returned to baseline and stable Postop Assessment: no apparent nausea or vomiting Anesthetic complications: no   No notable events documented.  Last Vitals:  Vitals:   01/07/24 1542 01/07/24 1543  BP:    Pulse:  92  Resp: 16 17  Temp:    SpO2:  98%    Last Pain:  Vitals:   01/07/24 1543  TempSrc:   PainSc: 6                  [name]      "

## 2024-01-07 NOTE — Transfer of Care (Signed)
 Immediate Anesthesia Transfer of Care Note  Patient: Amanda Chang  Procedure(s) Performed: CYSTOSCOPY/URETEROSCOPY/HOLMIUM LASER/STENT PLACEMENT (Left) CYSTOSCOPY, WITH RETROGRADE PYELOGRAM (Left)  Patient Location: PACU  Anesthesia Type:General  Level of Consciousness: awake  Airway & Oxygen Therapy: Patient Spontanous Breathing  Post-op Assessment: Report given to RN and Post -op Vital signs reviewed and stable  Post vital signs: Reviewed and stable  Last Vitals:  Vitals Value Taken Time  BP 107/79 01/07/24 14:15  Temp    Pulse 81 01/07/24 14:20  Resp 11 01/07/24 14:20  SpO2 96 % 01/07/24 14:20  Vitals shown include unfiled device data.  Last Pain:  Vitals:   01/07/24 1215  TempSrc: Oral  PainSc:       Patients Stated Pain Goal: 0 (01/07/24 1211)  Complications: No notable events documented.

## 2024-01-08 ENCOUNTER — Encounter (HOSPITAL_COMMUNITY): Payer: Self-pay | Admitting: Urology

## 2024-01-09 NOTE — Progress Notes (Signed)
 After obtaining consent, and per orders of [facility], injection of Depoprovera 150mg  given by Candelaria LITTIE Birmingham. Patient instructed to remain in clinic for 10 minutes afterwards, and to report any adverse reaction to me immediately.

## 2024-01-15 ENCOUNTER — Encounter (HOSPITAL_BASED_OUTPATIENT_CLINIC_OR_DEPARTMENT_OTHER): Payer: Self-pay

## 2024-01-15 ENCOUNTER — Emergency Department (HOSPITAL_BASED_OUTPATIENT_CLINIC_OR_DEPARTMENT_OTHER)

## 2024-01-15 ENCOUNTER — Other Ambulatory Visit: Payer: Self-pay

## 2024-01-15 ENCOUNTER — Emergency Department (HOSPITAL_BASED_OUTPATIENT_CLINIC_OR_DEPARTMENT_OTHER): Admission: EM | Admit: 2024-01-15 | Discharge: 2024-01-15 | Disposition: A | Discharge status: Home / Self Care

## 2024-01-15 DIAGNOSIS — R1032 Left lower quadrant pain: Secondary | ICD-10-CM | POA: Diagnosis present

## 2024-01-15 DIAGNOSIS — R0602 Shortness of breath: Secondary | ICD-10-CM | POA: Insufficient documentation

## 2024-01-15 DIAGNOSIS — R55 Syncope and collapse: Secondary | ICD-10-CM | POA: Diagnosis not present

## 2024-01-15 DIAGNOSIS — R319 Hematuria, unspecified: Secondary | ICD-10-CM | POA: Diagnosis not present

## 2024-01-15 DIAGNOSIS — R300 Dysuria: Secondary | ICD-10-CM | POA: Insufficient documentation

## 2024-01-15 DIAGNOSIS — R0789 Other chest pain: Secondary | ICD-10-CM | POA: Insufficient documentation

## 2024-01-15 DIAGNOSIS — R1030 Lower abdominal pain, unspecified: Secondary | ICD-10-CM

## 2024-01-15 LAB — BASIC METABOLIC PANEL WITH GFR
Anion gap: 14 (ref 5–15)
BUN: 20 mg/dL (ref 6–20)
CO2: 19 mmol/L — ABNORMAL LOW (ref 22–32)
Calcium: 9.3 mg/dL (ref 8.9–10.3)
Chloride: 107 mmol/L (ref 98–111)
Creatinine, Ser: 0.93 mg/dL (ref 0.44–1.00)
GFR, Estimated: >60 mL/min
Glucose, Bld: 104 mg/dL — ABNORMAL HIGH (ref 70–99)
Potassium: 3.2 mmol/L — ABNORMAL LOW (ref 3.5–5.1)
Sodium: 140 mmol/L (ref 135–145)

## 2024-01-15 LAB — URINALYSIS, ROUTINE W REFLEX MICROSCOPIC
Bilirubin Urine: NEGATIVE
Glucose, UA: NEGATIVE mg/dL
Ketones, ur: NEGATIVE mg/dL
Nitrite: NEGATIVE
Protein, ur: >=300 mg/dL — AB
Specific Gravity, Urine: >=1.03 (ref 1.005–1.030)
pH: 6 (ref 5.0–8.0)

## 2024-01-15 LAB — CBC
HCT: 39.3 % (ref 36.0–46.0)
Hemoglobin: 14.1 g/dL (ref 12.0–15.0)
MCH: 31.6 pg (ref 26.0–34.0)
MCHC: 35.9 g/dL (ref 30.0–36.0)
MCV: 88.1 fL (ref 80.0–100.0)
Platelets: 282 K/uL (ref 150–400)
RBC: 4.46 MIL/uL (ref 3.87–5.11)
RDW: 11.9 % (ref 11.5–15.5)
WBC: 7.1 K/uL (ref 4.0–10.5)
nRBC: 0 % (ref 0.0–0.2)

## 2024-01-15 LAB — URINALYSIS, MICROSCOPIC (REFLEX): RBC / HPF: >50 RBC/hpf (ref 0–5)

## 2024-01-15 LAB — TROPONIN T, HIGH SENSITIVITY
Troponin T High Sensitivity: <15 ng/L (ref 0–19)
Troponin T High Sensitivity: <15 ng/L (ref 0–19)

## 2024-01-15 LAB — D-DIMER, QUANTITATIVE: D-Dimer, Quant: <0.27 ug{FEU}/mL (ref 0.00–0.50)

## 2024-01-15 LAB — HCG, SERUM, QUALITATIVE: Preg, Serum: NEGATIVE

## 2024-01-15 MED ORDER — CEPHALEXIN 500 MG PO CAPS: 500.0000 mg | ORAL_CAPSULE | Freq: Four times a day (QID) | ORAL | 0 refills | Status: DC

## 2024-01-15 MED ORDER — IOHEXOL 300 MG/ML  SOLN
100.0000 mL | Freq: Once | INTRAMUSCULAR | Status: AC | PRN
  Administered 2024-01-15: 100 mL via INTRAVENOUS

## 2024-01-15 MED ORDER — OXYCODONE HCL 5 MG PO TABS
5.0000 mg | ORAL_TABLET | Freq: Once | ORAL | Status: AC
  Administered 2024-01-15: 5 mg via ORAL
  Filled 2024-01-15: qty 1

## 2024-01-15 MED ORDER — POTASSIUM CHLORIDE CRYS ER 20 MEQ PO TBCR
40.0000 meq | EXTENDED_RELEASE_TABLET | Freq: Once | ORAL | Status: AC
  Administered 2024-01-15: 40 meq via ORAL
  Filled 2024-01-15: qty 2

## 2024-01-15 MED ORDER — KETOROLAC TROMETHAMINE 15 MG/ML IJ SOLN
15.0000 mg | Freq: Once | INTRAMUSCULAR | Status: AC
  Administered 2024-01-15: 15 mg via INTRAVENOUS
  Filled 2024-01-15: qty 1

## 2024-01-15 MED ORDER — OXYCODONE HCL 5 MG PO TABS: 5.0000 mg | ORAL_TABLET | Freq: Four times a day (QID) | ORAL | 0 refills | Status: AC | PRN

## 2024-01-15 MED ORDER — OXYCODONE HCL 5 MG PO TABS: 5.0000 mg | ORAL_TABLET | Freq: Four times a day (QID) | ORAL | 0 refills | Status: DC | PRN

## 2024-01-15 MED ORDER — CEPHALEXIN 500 MG PO CAPS: 500.0000 mg | ORAL_CAPSULE | Freq: Four times a day (QID) | ORAL | 0 refills | Status: AC

## 2024-01-15 NOTE — ED Provider Notes (Signed)
 " [place] [facility] Provider Note   CSN: [id number] Arrival date & time: 01/15/24  9248     Patient presents with: Loss of Consciousness   Amanda Chang is a 38 y.o. female.   Is a 38 year old female presenting emergency department complaining of left lower abdominal pain.  Had kidney stone surgery on 1222 stent placed, remove the stent a few days ago had some hematuria, continues to have dysuria.  This morning thinks she syncopized that she felt quite tired and laid down on the bed and felt that she passed out for a few moments.  Awoke was having some left-sided chest discomfort described as a pressure.  Also some shortness of breath.  No prior cardiac history.  No lung disease.  Otherwise low risk for DVT based on Wells criteria   Loss of Consciousness      Prior to Admission medications  Medication Sig Start Date End Date Taking? Authorizing Provider  cephALEXin (KEFLEX) 500 MG capsule Take 500 mg by mouth 3 (three) times daily.    [provider]  levocetirizine (XYZAL) 5 MG tablet Take 5 mg by mouth every evening.    [provider]  medroxyPROGESTERone  (DEPO-PROVERA ) 150 MG/ML injection Inject 150 mg into the muscle every 3 (three) months.    [provider]  meloxicam (MOBIC) 15 MG tablet Take 15 mg by mouth in the morning.    [provider]  ondansetron  (ZOFRAN ) 4 MG tablet Take 1 tablet (4 mg total) by mouth every 8 (eight) hours as needed for up to 30 doses for nausea or vomiting. 01/07/24   Selma Donnice SAUNDERS, MD  ondansetron  (ZOFRAN -ODT) 4 MG disintegrating tablet Take 4 mg by mouth every 6 (six) hours as needed for nausea or vomiting.    [provider]  oxyCODONE  (ROXICODONE ) 5 MG immediate release tablet Take 1 tablet (5 mg total) by mouth every 8 (eight) hours as needed for up to 12 doses. 01/07/24   Selma Donnice SAUNDERS, MD  sodium chloride  (OCEAN) 0.65 % SOLN nasal spray Place 1  spray into both nostrils as needed for congestion.    [provider]  tamsulosin  (FLOMAX ) 0.4 MG CAPS capsule Take 1 capsule (0.4 mg total) by mouth daily. 12/21/23   Haviland, Julie, MD  terbinafine (LAMISIL) 250 MG tablet Take 250 mg by mouth in the morning.    [provider]    Allergies: Acetaminophen  and Belladonna alkaloids    Review of Systems  Cardiovascular:  Positive for syncope.    Updated Vital Signs BP (!) 130/97 (BP Location: Left Arm)   Pulse 61   Temp 98 F (36.7 C) (Oral)   Resp 18   Ht 5' 2 (1.575 m)   Wt 78 kg   SpO2 99%   BMI 31.46 kg/m   Physical Exam Vitals and nursing note reviewed.  HENT:     Head: Normocephalic.     Nose: Nose normal.  Eyes:     Conjunctiva/sclera: Conjunctivae normal.  Cardiovascular:     Rate and Rhythm: Normal rate and regular rhythm.  Pulmonary:     Effort: Pulmonary effort is normal.  Abdominal:     General: Abdomen is flat.     Palpations: Abdomen is soft.     Tenderness: There is abdominal tenderness. There is no guarding or rebound.  Musculoskeletal:     Right lower leg: No edema.     Left lower leg: No edema.  Skin:  General: Skin is warm.     Capillary Refill: Capillary refill takes less than 2 seconds.  Neurological:     Mental Status: She is alert and oriented to person, place, and time.  Psychiatric:        Mood and Affect: Mood normal.        Behavior: Behavior normal.     (all labs ordered are listed, but only abnormal results are displayed) Labs Reviewed  BASIC METABOLIC PANEL WITH GFR - Abnormal; Notable for the following components:      Result Value   Potassium 3.2 (*)    CO2 19 (*)    Glucose, Bld 104 (*)    All other components within normal limits  URINALYSIS, ROUTINE W REFLEX MICROSCOPIC - Abnormal; Notable for the following components:   APPearance HAZY (*)    Hgb urine dipstick MODERATE (*)    Protein, ur >=300 (*)    Leukocytes,Ua TRACE (*)    All other components  within normal limits  URINALYSIS, MICROSCOPIC (REFLEX) - Abnormal; Notable for the following components:   Bacteria, UA FEW (*)    All other components within normal limits  CBC  D-DIMER, QUANTITATIVE  HCG, SERUM, QUALITATIVE  TROPONIN T, HIGH SENSITIVITY  TROPONIN T, HIGH SENSITIVITY    EKG: None  Radiology: CT ABDOMEN PELVIS W CONTRAST Result Date: 01/15/2024 EXAM: CT ABDOMEN AND PELVIS WITH CONTRAST 01/15/2024 10:39:18 AM TECHNIQUE: CT of the abdomen and pelvis was performed with the administration of 100 mL iohexol  (OMNIPAQUE ) 300 MG/ML solution. Multiplanar reformatted images are provided for review. Automated exposure control, iterative reconstruction, and/or weight-based adjustment of the mA/kV was utilized to reduce the radiation dose to as low as reasonably achievable. COMPARISON: 12/21/2023 CLINICAL HISTORY: Status post cystoscopy with retrograde pyelogram and stent placement on 01/07/2024. Recent obstructing left ureteral stone. FINDINGS: LOWER CHEST: No acute abnormality. LIVER: The liver is unremarkable. GALLBLADDER AND BILE DUCTS: Gallbladder is unremarkable. No biliary ductal dilatation. SPLEEN: No acute abnormality. PANCREAS: No acute abnormality. ADRENAL GLANDS: No acute abnormality. KIDNEYS, URETERS AND BLADDER: There is a 2 cm length segment of distal left pelvic ureter with hyperenhancement and mild smooth wall thickening extending to the left ureterovesical junction, with associated mild to moderate left hydroureteronephrosis and delayed left contrast nephrogram. Normal caliber right ureter. No right hydronephrosis. Simple 1.4 cm medial lower left renal cyst with several scattered subcentimeter hypodensities bilateral renal cortical lesions that are too small to characterize. Per consensus, no follow-up is needed for simple Bosniak type 1 and 2 renal cysts, unless the patient has a malignancy history or risk factors. No appreciable ureteral stones. No perinephric or periureteral  stranding. Bladder is collapsed and otherwise normal. GI AND BOWEL: Stomach demonstrates no acute abnormality. Normal caliber small bowel. Normal appendix. No small or large bowel wall thickening. No significant colonic diverticulosis. PERITONEUM AND RETROPERITONEUM: No ascites. No free air. VASCULATURE: Aorta is normal in caliber. LYMPH NODES: No lymphadenopathy. REPRODUCTIVE ORGANS: No acute abnormality. BONES AND SOFT TISSUES: No acute osseous abnormality. No focal soft tissue abnormality. IMPRESSION: 1. Segment of distal left pelvic ureter measuring 2 cm length with smooth mild wall thickening and hyperenhancement, extending to the left ureterovesical junction, with associated mild to moderate left hydroureteronephrosis and delayed left contrast nephrogram, and no appreciable ureteral stones. Findings are likely infectious/inflammatory. Electronically signed by: Selinda Blue MD 01/15/2024 12:18 PM EST RP Workstation: [id number]   DG Chest Portable 1 View Result Date: 01/15/2024 CLINICAL DATA:  Chest pain EXAM: PORTABLE CHEST 1 VIEW  COMPARISON:  April 10, 2019 FINDINGS: The heart size and mediastinal contours are within normal limits. Both lungs are clear. The visualized skeletal structures are unremarkable. IMPRESSION: No active disease. Electronically Signed   By: Lynwood Landy Raddle M.D.   On: 01/15/2024 09:28     Procedures   Medications Ordered in the ED  oxyCODONE  (Oxy IR/ROXICODONE ) immediate release tablet 5 mg (5 mg Oral Given 01/15/24 0908)  potassium chloride  SA (KLOR-CON  M) CR tablet 40 mEq (40 mEq Oral Given 01/15/24 1039)  iohexol  (OMNIPAQUE ) 300 MG/ML solution 100 mL (100 mLs Intravenous Contrast Given 01/15/24 1029)  ketorolac  (TORADOL ) 15 MG/ML injection 15 mg (15 mg Intravenous Given 01/15/24 1118)    Clinical Course as of 01/15/24 1232  Tue Jan 15, 2024  0945 Troponin T High Sensitivity: <15 ACS online [TY]  0945 D-Dimer, Quant: <0.27 PE unlikely [TY]  0946 Hemoglobin: 14.1  [TY]  0946 Urinalysis, Routine w reflex microscopic -Urine, Clean Catch(!) No overt UTI, but does have some bacteria and she does have some dysuria.  [TY]  0946 Preg, Serum: NEGATIVE Ectopic unlikely [TY]  0946 Basic metabolic panel(!) Stable renal function.  Hypokalemia noted [TY]  1133 Troponin T High Sensitivity: <15 2 negative troponins makes ACS quite unlikely [TY]  1224 CT ABDOMEN PELVIS W CONTRAST IMPRESSION: 1. Segment of distal left pelvic ureter measuring 2 cm length with smooth mild wall thickening and hyperenhancement, extending to the left ureterovesical junction, with associated mild to moderate left hydroureteronephrosis and delayed left contrast nephrogram, and no appreciable ureteral stones. Findings are likely infectious/inflammatory.   [TY]  1228 Patient is feeling improved after pain medications.  Workup largely reassuring.  CT scan with infection versus inflammatory.  Urine not overtly indicative of infection, with only few bacteria, but is having dysuria.  Will  get a short course of pain medications until she is able to see her urologist.  Return precautions given. [TY]    Clinical Course User Index [TY] Neysa Caron PARAS, DO                                 Medical Decision Making This is a 38 year old female complex past medical history to include obesity, kidney stones, schizophrenia, GERD presenting emergency department after syncopal episode this morning.  Recent kidney stone with lithotripsy and stenting.  Complaining of some residual pain.  Ran out of her narcotics.  She is afebrile nontachycardic, hemodynamically stable.  Maintaining oxygen saturation on room air.  Her initial EKG without ischemic changes on my independent review.  Initial troponin negative.  Will get basic screening labs, chest x-ray to evaluate for causes of syncope. low suspicion for PE.  Will get a D-dimer to exclude.  Given persistent pain after procedure we will get CT scan of abdomen.   Will treat with pain medications and reevaluate.  See ED course for further MDM and final disposition.  Amount and/or Complexity of Data Reviewed External Data Reviewed:     Details: See ED course Labs: ordered. Decision-making details documented in ED Course. Radiology: ordered and independent interpretation performed. Decision-making details documented in ED Course.    Details: Do not appreciate obvious free air on CT scan. ECG/medicine tests: ordered and independent interpretation performed.    Details: See above  Risk Prescription drug management. Decision regarding hospitalization. Diagnosis or treatment significantly limited by social determinants of health.      Final diagnoses:  None  ED Discharge Orders     None          Neysa Caron PARAS, DO 01/15/24 1232  "

## 2024-01-15 NOTE — ED Triage Notes (Signed)
"   Pt had surgery for kidney stone on 12/22, had the stent removed on Thursday. Now is having more pain, which she was told would only last 1-2 days. Feels like trouble urinating, still having blood in her urine, and passed a bloodclot out in her urine the size of a thumbnail on Sunday. States that this morning she started feeling tired, laid down in the bed and lost consciousness for 3-4 seconds, says that it felt like her heart stopped and had tingling in her left arm. States that now it feels like someone is sitting on her left boob.  "

## 2024-01-15 NOTE — ED Notes (Signed)
 Patient transported to CT

## 2024-01-15 NOTE — Discharge Instructions (Addendum)
 Please follow-up with your urologist.  We are prescribing you antibiotics.  Please take them as prescribed for the full course.  You may take Tylenol  alternating with ibuprofen  for baseline pain control.  Use the narcotics for breakthrough pain.  Return for fevers, chills, lightheadedness, passout, chest pain returns, shortness of breath, difficulty breathing, uncontrolled nausea vomiting, stop making urine or any new or worsening symptoms that are concerning to you.

## 2024-01-17 ENCOUNTER — Emergency Department (HOSPITAL_BASED_OUTPATIENT_CLINIC_OR_DEPARTMENT_OTHER)
Admission: EM | Admit: 2024-01-17 | Discharge: 2024-01-17 | Disposition: A | Discharge status: Home / Self Care | Source: Home / Self Care

## 2024-01-17 ENCOUNTER — Other Ambulatory Visit: Payer: Self-pay

## 2024-01-17 ENCOUNTER — Encounter (HOSPITAL_BASED_OUTPATIENT_CLINIC_OR_DEPARTMENT_OTHER): Payer: Self-pay

## 2024-01-17 DIAGNOSIS — R1032 Left lower quadrant pain: Secondary | ICD-10-CM | POA: Diagnosis not present

## 2024-01-17 DIAGNOSIS — R10A2 Flank pain, left side: Secondary | ICD-10-CM | POA: Diagnosis present

## 2024-01-17 LAB — COMPREHENSIVE METABOLIC PANEL WITH GFR
ALT: 16 U/L (ref 0–44)
AST: 18 U/L (ref 15–41)
Albumin: 4.5 g/dL (ref 3.5–5.0)
Alkaline Phosphatase: 70 U/L (ref 38–126)
Anion gap: 13 (ref 5–15)
BUN: 12 mg/dL (ref 6–20)
CO2: 20 mmol/L — ABNORMAL LOW (ref 22–32)
Calcium: 9.1 mg/dL (ref 8.9–10.3)
Chloride: 104 mmol/L (ref 98–111)
Creatinine, Ser: 0.89 mg/dL (ref 0.44–1.00)
GFR, Estimated: >60 mL/min
Glucose, Bld: 96 mg/dL (ref 70–99)
Potassium: 3.2 mmol/L — ABNORMAL LOW (ref 3.5–5.1)
Sodium: 137 mmol/L (ref 135–145)
Total Bilirubin: 0.6 mg/dL (ref 0.0–1.2)
Total Protein: 7.2 g/dL (ref 6.5–8.1)

## 2024-01-17 LAB — CBC WITH DIFFERENTIAL/PLATELET
Abs Immature Granulocytes: 0.02 K/uL (ref 0.00–0.07)
Basophils Absolute: 0 K/uL (ref 0.0–0.1)
Basophils Relative: 1 %
Eosinophils Absolute: 0 K/uL (ref 0.0–0.5)
Eosinophils Relative: 0 %
HCT: 38.3 % (ref 36.0–46.0)
Hemoglobin: 14.2 g/dL (ref 12.0–15.0)
Immature Granulocytes: 0 %
Lymphocytes Relative: 20 %
Lymphs Abs: 1.5 K/uL (ref 0.7–4.0)
MCH: 32.4 pg (ref 26.0–34.0)
MCHC: 37.1 g/dL — ABNORMAL HIGH (ref 30.0–36.0)
MCV: 87.4 fL (ref 80.0–100.0)
Monocytes Absolute: 0.6 K/uL (ref 0.1–1.0)
Monocytes Relative: 8 %
Neutro Abs: 5.3 K/uL (ref 1.7–7.7)
Neutrophils Relative %: 71 %
Platelets: 289 K/uL (ref 150–400)
RBC: 4.38 MIL/uL (ref 3.87–5.11)
RDW: 11.8 % (ref 11.5–15.5)
WBC: 7.4 K/uL (ref 4.0–10.5)
nRBC: 0 % (ref 0.0–0.2)

## 2024-01-17 LAB — URINALYSIS, W/ REFLEX TO CULTURE (INFECTION SUSPECTED)
Bilirubin Urine: NEGATIVE
Glucose, UA: NEGATIVE mg/dL
Ketones, ur: NEGATIVE mg/dL
Nitrite: NEGATIVE
Protein, ur: NEGATIVE mg/dL
Specific Gravity, Urine: 1.01 (ref 1.005–1.030)
pH: 6.5 (ref 5.0–8.0)

## 2024-01-17 LAB — URINE CULTURE: Culture: >=100000 — AB

## 2024-01-17 LAB — LIPASE, BLOOD: Lipase: 59 U/L — ABNORMAL HIGH (ref 11–51)

## 2024-01-17 MED ORDER — MORPHINE SULFATE (PF) 4 MG/ML IV SOLN
4.0000 mg | Freq: Once | INTRAVENOUS | Status: AC
  Administered 2024-01-17: 4 mg via INTRAVENOUS
  Filled 2024-01-17: qty 1

## 2024-01-17 MED ORDER — KETOROLAC TROMETHAMINE 10 MG PO TABS: 10.0000 mg | ORAL_TABLET | Freq: Three times a day (TID) | ORAL | 0 refills | Status: AC | PRN

## 2024-01-17 MED ORDER — KETOROLAC TROMETHAMINE 15 MG/ML IJ SOLN
15.0000 mg | Freq: Once | INTRAMUSCULAR | Status: AC
  Administered 2024-01-17: 15 mg via INTRAVENOUS
  Filled 2024-01-17: qty 1

## 2024-01-17 MED ORDER — ONDANSETRON HCL 4 MG/2ML IJ SOLN
4.0000 mg | Freq: Once | INTRAMUSCULAR | Status: AC
  Administered 2024-01-17: 4 mg via INTRAVENOUS
  Filled 2024-01-17: qty 2

## 2024-01-17 NOTE — ED Triage Notes (Signed)
 Pt states that antibiotic she was given from the other day is not working, and saw that the urine culture that was sent on the 30th showed E-coli. Oncall urologist unable to prescribe over the phone, pt still having pain. Pt has a hx of E-coli.

## 2024-01-17 NOTE — ED Notes (Signed)
 Pt returned to RN station reporting toradol  is not covered by her insurance, requesting different prescription. EDP Savannah made aware.

## 2024-01-17 NOTE — ED Provider Notes (Cosign Needed Addendum)
 " [place] [facility] Provider Note   CSN: [id number] Arrival date & time: 01/17/24  1151     Patient presents with: UTI   Amanda Chang is a 39 y.o. female with PMHx GERD, kidney stones, polysubstance use disorder who presents to ED concerned for continuing left flank pain an dysuria since 12/25. Patient was seen in ED on 12/30 and symptoms continue to be identical. Patient does have an appointment with her urologist tomorrow, but ran out of Percocet today and pain is still severe so she came to ED. Patient also concerned because her urine culture was positive for E.coli and she is unsure if she was started on the right ABX a couple days ago.   HPI     Prior to Admission medications  Medication Sig Start Date End Date Taking? Authorizing Provider  ketorolac  (TORADOL ) 10 MG tablet Take 1 tablet (10 mg total) by mouth every 8 (eight) hours as needed for up to 3 days. 01/17/24 01/20/24 Yes Aadarsh Cozort, Nidia F, PA-C  cephALEXin (KEFLEX) 500 MG capsule Take 1 capsule (500 mg total) by mouth 4 (four) times daily. 01/15/24   Neysa Caron PARAS, DO  levocetirizine (XYZAL) 5 MG tablet Take 5 mg by mouth every evening.    [provider]  medroxyPROGESTERone  (DEPO-PROVERA ) 150 MG/ML injection Inject 150 mg into the muscle every 3 (three) months.    [provider]  ondansetron  (ZOFRAN ) 4 MG tablet Take 1 tablet (4 mg total) by mouth every 8 (eight) hours as needed for up to 30 doses for nausea or vomiting. 01/07/24   Selma Donnice SAUNDERS, MD  ondansetron  (ZOFRAN -ODT) 4 MG disintegrating tablet Take 4 mg by mouth every 6 (six) hours as needed for nausea or vomiting.    [provider]  oxyCODONE  (ROXICODONE ) 5 MG immediate release tablet Take 1 tablet (5 mg total) by mouth every 6 (six) hours as needed for up to 2 days for severe pain (pain score 7-10). 01/15/24 01/17/24  Neysa Caron PARAS, DO  sodium chloride  (OCEAN) 0.65 % SOLN nasal spray  Place 1 spray into both nostrils as needed for congestion.    [provider]  tamsulosin  (FLOMAX ) 0.4 MG CAPS capsule Take 1 capsule (0.4 mg total) by mouth daily. 12/21/23   Haviland, Julie, MD  terbinafine (LAMISIL) 250 MG tablet Take 250 mg by mouth in the morning.    [provider]    Allergies: Acetaminophen  and Belladonna alkaloids    Review of Systems  Gastrointestinal:  Positive for abdominal pain.    Updated Vital Signs BP (!) 142/110   Pulse 90   Temp 98.3 F (36.8 C) (Oral)   Resp 16   Ht 5' 2 (1.575 m)   Wt 78.5 kg   SpO2 100%   BMI 31.64 kg/m   Physical Exam Vitals and nursing note reviewed.  Constitutional:      General: She is not in acute distress.    Appearance: She is not ill-appearing or toxic-appearing.  HENT:     Head: Normocephalic and atraumatic.     Mouth/Throat:     Mouth: Mucous membranes are moist.  Eyes:     General: No scleral icterus.       Right eye: No discharge.        Left eye: No discharge.     Conjunctiva/sclera: Conjunctivae normal.  Cardiovascular:     Rate and Rhythm: Normal rate and regular rhythm.     Pulses: Normal  pulses.     Heart sounds: Normal heart sounds. No murmur heard. Pulmonary:     Effort: Pulmonary effort is normal. No respiratory distress.     Breath sounds: Normal breath sounds. No wheezing, rhonchi or rales.  Abdominal:     General: Abdomen is flat. Bowel sounds are normal. There is no distension.     Palpations: Abdomen is soft. There is no mass.     Tenderness: There is abdominal tenderness.     Comments: LLQ tenderness to palpation  Musculoskeletal:     Right lower leg: No edema.     Left lower leg: No edema.  Skin:    General: Skin is warm and dry.     Findings: No rash.  Neurological:     General: No focal deficit present.     Mental Status: She is alert and oriented to person, place, and time. Mental status is at baseline.  Psychiatric:        Mood and Affect: Mood normal.         Behavior: Behavior normal.     (all labs ordered are listed, but only abnormal results are displayed) Labs Reviewed  URINALYSIS, W/ REFLEX TO CULTURE (INFECTION SUSPECTED) - Abnormal; Notable for the following components:      Result Value   Hgb urine dipstick LARGE (*)    Leukocytes,Ua TRACE (*)    Bacteria, UA FEW (*)    All other components within normal limits  CBC WITH DIFFERENTIAL/PLATELET - Abnormal; Notable for the following components:   MCHC 37.1 (*)    All other components within normal limits  COMPREHENSIVE METABOLIC PANEL WITH GFR - Abnormal; Notable for the following components:   Potassium 3.2 (*)    CO2 20 (*)    All other components within normal limits  LIPASE, BLOOD - Abnormal; Notable for the following components:   Lipase 59 (*)    All other components within normal limits    EKG: None  Radiology: No results found.   Procedures   Medications Ordered in the ED  morphine  (PF) 4 MG/ML injection 4 mg (4 mg Intravenous Given 01/17/24 1319)  ondansetron  (ZOFRAN ) injection 4 mg (4 mg Intravenous Given 01/17/24 1319)  ketorolac  (TORADOL ) 15 MG/ML injection 15 mg (15 mg Intravenous Given 01/17/24 1539)                                    Medical Decision Making Amount and/or Complexity of Data Reviewed Labs: ordered.  Risk Prescription drug management.    This patient presents to the ED for concern of abdominal pain, this involves an extensive number of treatment options, and is a complaint that carries with it a high risk of complications and morbidity.  The differential diagnosis includes gastroenteritis, colitis, small bowel obstruction, appendicitis, cholecystitis, pancreatitis, nephrolithiasis, UTI, pyleonephritis   Co morbidities that complicate the patient evaluation   GERD, kidney stones, polysubstance use disorder   Additional history obtained:  12/30 CT abd/pelv:  Segment of distal left pelvic ureter measuring 2 cm length with smooth  mild wall thickening and hyperenhancement, extending to the left ureterovesical junction, with associated mild to moderate left hydroureteronephrosis and delayed left contrast nephrogram, and no appreciable ureteral stones. Findings are likely infectious/inflammatory. 12/30 urine culture: E. coli susceptible to all the antibiotics tested.    Problem List / ED Course / Critical interventions / Medication management  Patient presented for abdominal pain.  Physical exam concerning for left lower quadrant abdominal pain.  Rest of physical exam reassuring.  Patient afebrile with stable vitals.  Patient's symptoms today have not changed in the past 7 days.  Patient with a reassuring CT scan and lab workup a couple days ago.  Patient NAD. I Ordered, and personally interpreted labs.  UA not concerning for infection.  CBC without leukocytosis or anemia.  CMP with mild hypokalemia at 3.2.  CO2 is also mildly low at 20.  Lipase mildly elevated at 59. Shared decision making with patient.  We are proceeding without CT imaging today since symptoms feel consistent with the pain that she has been having for the past 7 days. No new symptoms today. CT scan obtained 2 days ago was reassuring.   I requested consultation with the Urologist on-call Dr. Elisabeth,  and discussed lab and imaging findings as well as pertinent plan - they recommend: Toradol  and outpatient follow up. Shared all results with patient.  Answered all questions.  Will prescribe Toradol  to help with pain.  Patient agrees to follow-up with her urology appointment tomorrow. I have reviewed the patients home medicines and have made adjustments as needed The patient has been appropriately medically screened and/or stabilized in the ED. I have low suspicion for any other emergent medical condition which would require further screening, evaluation or treatment in the ED or require inpatient management. At time of discharge the patient is hemodynamically stable  and in no acute distress. I have discussed work-up results and diagnosis with patient and answered all questions. Patient is agreeable with discharge plan. We discussed strict return precautions for returning to the emergency department and they verbalized understanding.     Social Determinants of Health:  none       Final diagnoses:  Left lower quadrant abdominal pain    ED Discharge Orders          Ordered    ketorolac  (TORADOL ) 10 MG tablet  Every 8 hours PRN        01/17/24 1717              Hoy Nidia FALCON, [place] 01/17/24 1721  "

## 2024-01-17 NOTE — Discharge Instructions (Addendum)
 As discussed, you will need to follow-up with your urologist during your appointment tomorrow.  Seek emergency care experiencing any new or worsening symptoms.

## 2024-01-18 ENCOUNTER — Telehealth (HOSPITAL_BASED_OUTPATIENT_CLINIC_OR_DEPARTMENT_OTHER): Payer: Self-pay

## 2024-01-18 NOTE — Telephone Encounter (Signed)
 Post ED Visit - Positive Culture Follow-up  Culture report reviewed by antimicrobial stewardship pharmacist: [name] [x]  Elma Fail, Pharm.D. []  Venetia Gully, Pharm.D., BCPS AQ-ID []  Garrel Crews, Pharm.D., BCPS []  Almarie Lunger, [address].D., BCPS []  [place], [place].D., BCPS, AAHIVP []  Rosaline Bihari, Pharm.D., BCPS, AAHIVP []  Vernell Meier, PharmD, BCPS []  Latanya Hint, PharmD, BCPS []  Donald Medley, PharmD, BCPS []  Rocky Bold, PharmD []  Dorothyann Alert, PharmD, BCPS []  Morene Babe, PharmD  [facility] []  Rosaline Edison, PharmD []  Romona Bliss, PharmD []  Dolphus Roller, PharmD []  Veva Seip, Rph []  Vernell Daunt) Leonce, PharmD []  Eva Allis, PharmD []  Rosaline Millet, PharmD []  Iantha Batch, PharmD []  Arvin Gauss, PharmD []  Wanda Hasting, PharmD []  Ronal Rav, PharmD []  Rocky Slade, PharmD []  Bard Jeans, PharmD   Positive urine culture Treated with Cephalexin, organism sensitive to the same and no further patient follow-up is required at this time.  Ruth Camelia Elbe 01/18/2024, 1:31 PM

## 2024-01-25 ENCOUNTER — Other Ambulatory Visit: Payer: Self-pay

## 2024-01-25 ENCOUNTER — Encounter (HOSPITAL_BASED_OUTPATIENT_CLINIC_OR_DEPARTMENT_OTHER): Payer: Self-pay

## 2024-01-25 ENCOUNTER — Emergency Department (HOSPITAL_BASED_OUTPATIENT_CLINIC_OR_DEPARTMENT_OTHER): Admission: EM | Admit: 2024-01-25 | Discharge: 2024-01-25 | Disposition: A | Discharge status: Home / Self Care

## 2024-01-25 DIAGNOSIS — R1032 Left lower quadrant pain: Secondary | ICD-10-CM | POA: Insufficient documentation

## 2024-01-25 DIAGNOSIS — E876 Hypokalemia: Secondary | ICD-10-CM | POA: Diagnosis not present

## 2024-01-25 DIAGNOSIS — R300 Dysuria: Secondary | ICD-10-CM | POA: Diagnosis present

## 2024-01-25 DIAGNOSIS — R1031 Right lower quadrant pain: Secondary | ICD-10-CM | POA: Diagnosis not present

## 2024-01-25 DIAGNOSIS — R1030 Lower abdominal pain, unspecified: Secondary | ICD-10-CM

## 2024-01-25 LAB — CBC WITH DIFFERENTIAL/PLATELET
Abs Immature Granulocytes: 0.02 K/uL (ref 0.00–0.07)
Basophils Absolute: 0 K/uL (ref 0.0–0.1)
Basophils Relative: 0 %
Eosinophils Absolute: 0 K/uL (ref 0.0–0.5)
Eosinophils Relative: 0 %
HCT: 39.3 % (ref 36.0–46.0)
Hemoglobin: 13.9 g/dL (ref 12.0–15.0)
Immature Granulocytes: 0 %
Lymphocytes Relative: 17 %
Lymphs Abs: 1.6 K/uL (ref 0.7–4.0)
MCH: 31.4 pg (ref 26.0–34.0)
MCHC: 35.4 g/dL (ref 30.0–36.0)
MCV: 88.9 fL (ref 80.0–100.0)
Monocytes Absolute: 0.7 K/uL (ref 0.1–1.0)
Monocytes Relative: 8 %
Neutro Abs: 7 K/uL (ref 1.7–7.7)
Neutrophils Relative %: 75 %
Platelets: 267 K/uL (ref 150–400)
RBC: 4.42 MIL/uL (ref 3.87–5.11)
RDW: 12.1 % (ref 11.5–15.5)
WBC: 9.4 K/uL (ref 4.0–10.5)
nRBC: 0 % (ref 0.0–0.2)

## 2024-01-25 LAB — URINALYSIS, ROUTINE W REFLEX MICROSCOPIC
Bilirubin Urine: NEGATIVE
Glucose, UA: NEGATIVE mg/dL
Ketones, ur: NEGATIVE mg/dL
Leukocytes,Ua: NEGATIVE
Nitrite: NEGATIVE
Protein, ur: 100 mg/dL — AB
Specific Gravity, Urine: >=1.03 (ref 1.005–1.030)
pH: 6 (ref 5.0–8.0)

## 2024-01-25 LAB — COMPREHENSIVE METABOLIC PANEL WITH GFR
ALT: 13 U/L (ref 0–44)
AST: 16 U/L (ref 15–41)
Albumin: 4.4 g/dL (ref 3.5–5.0)
Alkaline Phosphatase: 68 U/L (ref 38–126)
Anion gap: 13 (ref 5–15)
BUN: 18 mg/dL (ref 6–20)
CO2: 21 mmol/L — ABNORMAL LOW (ref 22–32)
Calcium: 9.3 mg/dL (ref 8.9–10.3)
Chloride: 104 mmol/L (ref 98–111)
Creatinine, Ser: 0.85 mg/dL (ref 0.44–1.00)
GFR, Estimated: >60 mL/min
Glucose, Bld: 110 mg/dL — ABNORMAL HIGH (ref 70–99)
Potassium: 3 mmol/L — ABNORMAL LOW (ref 3.5–5.1)
Sodium: 138 mmol/L (ref 135–145)
Total Bilirubin: 0.7 mg/dL (ref 0.0–1.2)
Total Protein: 6.6 g/dL (ref 6.5–8.1)

## 2024-01-25 LAB — URINALYSIS, MICROSCOPIC (REFLEX)

## 2024-01-25 LAB — PREGNANCY, URINE: Preg Test, Ur: NEGATIVE

## 2024-01-25 LAB — LIPASE, BLOOD: Lipase: 80 U/L — ABNORMAL HIGH (ref 11–51)

## 2024-01-25 MED ORDER — KETOROLAC TROMETHAMINE 15 MG/ML IJ SOLN
15.0000 mg | Freq: Once | INTRAMUSCULAR | Status: AC
  Administered 2024-01-25: 15 mg via INTRAVENOUS
  Filled 2024-01-25: qty 1

## 2024-01-25 MED ORDER — MORPHINE SULFATE 15 MG PO TABS: 15.0000 mg | ORAL_TABLET | Freq: Four times a day (QID) | ORAL | 0 refills | Status: AC | PRN

## 2024-01-25 MED ORDER — POTASSIUM CHLORIDE CRYS ER 20 MEQ PO TBCR
40.0000 meq | EXTENDED_RELEASE_TABLET | Freq: Once | ORAL | Status: AC
  Administered 2024-01-25: 40 meq via ORAL
  Filled 2024-01-25: qty 2

## 2024-01-25 MED ORDER — PHENAZOPYRIDINE HCL 200 MG PO TABS: 200.0000 mg | ORAL_TABLET | Freq: Three times a day (TID) | ORAL | 0 refills | Status: AC

## 2024-01-25 MED ORDER — OXYCODONE HCL 5 MG PO TABS
5.0000 mg | ORAL_TABLET | Freq: Once | ORAL | Status: AC
  Administered 2024-01-25: 5 mg via ORAL
  Filled 2024-01-25: qty 1

## 2024-01-25 NOTE — ED Provider Notes (Signed)
 " [place] [facility] Provider Note   CSN: [id number] Arrival date & time: 01/25/24  1406     Patient presents with: Dysuria   Amanda Chang is a 39 y.o. female.   Patient with past h/o ureteral stone status post procedure for treatment of left-sided ureteral stone on 01/07/24, ureteral stent until 01/10/2024, ED follow-up visit on 12/30 with reassuring CT imaging started on antibiotic, ED follow-up 01/17/2024, subsequent follow-up with urology, past history of delusional disorder, GAD, schizophrenia, insomnia, unipolar major depression, and polysubstance abuse (psych eval 08/2023) --presents to the emergency department today for evaluation of ongoing lower abdominal pain.  She states that she has had this pain since her procedure.  She states that about 3 days ago she fell asleep accidentally lying on her right side and when she woke up the pain was worse.  Has been persistent in the lower abdomen.  She has had blood in her urine but this is clearing.  She does not feel like the pain is getting any better.  She reports nausea with dry heaves, treated at home with Zofran .  She describes the pain as knifelike.  She reports that at the time of recent follow-up with urology, she was placed on Cipro  and placed on celecoxib.  She is completing the Cipro  prescription today.  Reports last dose of oxycodone  was 5 days ago.  Urine culture from 01/15/2024 grew out pansensitive E. coli.       Prior to Admission medications  Medication Sig Start Date End Date Taking? Authorizing Provider  cephALEXin  (KEFLEX ) 500 MG capsule Take 1 capsule (500 mg total) by mouth 4 (four) times daily. 01/15/24   Neysa Caron PARAS, DO  levocetirizine (XYZAL) 5 MG tablet Take 5 mg by mouth every evening.    [provider]  medroxyPROGESTERone  (DEPO-PROVERA ) 150 MG/ML injection Inject 150 mg into the muscle every 3 (three) months.    [provider]   ondansetron  (ZOFRAN ) 4 MG tablet Take 1 tablet (4 mg total) by mouth every 8 (eight) hours as needed for up to 30 doses for nausea or vomiting. 01/07/24   Selma Donnice SAUNDERS, MD  ondansetron  (ZOFRAN -ODT) 4 MG disintegrating tablet Take 4 mg by mouth every 6 (six) hours as needed for nausea or vomiting.    [provider]  sodium chloride  (OCEAN) 0.65 % SOLN nasal spray Place 1 spray into both nostrils as needed for congestion.    [provider]  tamsulosin  (FLOMAX ) 0.4 MG CAPS capsule Take 1 capsule (0.4 mg total) by mouth daily. 12/21/23   Haviland, Julie, MD  terbinafine (LAMISIL) 250 MG tablet Take 250 mg by mouth in the morning.    [provider]    Allergies: Acetaminophen  and Belladonna alkaloids    Review of Systems  Updated Vital Signs BP (!) 117/90 (BP Location: Left Arm)   Pulse (!) 105   Temp 98.6 F (37 C)   Resp 20   SpO2 99%   Physical Exam Vitals and nursing note reviewed.  Constitutional:      General: She is not in acute distress.    Appearance: She is well-developed.  HENT:     Head: Normocephalic and atraumatic.     Right Ear: External ear normal.     Left Ear: External ear normal.     Nose: Nose normal.  Eyes:     Conjunctiva/sclera: Conjunctivae normal.  Cardiovascular:     Rate and Rhythm: Normal rate and regular  rhythm.     Heart sounds: No murmur heard. Pulmonary:     Effort: No respiratory distress.     Breath sounds: No wheezing, rhonchi or rales.  Abdominal:     Palpations: Abdomen is soft.     Tenderness: There is abdominal tenderness in the right lower quadrant, suprapubic area and left lower quadrant. There is no guarding or rebound.  Musculoskeletal:     Cervical back: Normal range of motion and neck supple.     Right lower leg: No edema.     Left lower leg: No edema.  Skin:    General: Skin is warm and dry.     Findings: No rash.  Neurological:     General: No focal deficit present.     Mental Status: She is  alert. Mental status is at baseline.     Motor: No weakness.  Psychiatric:        Mood and Affect: Mood normal.     (all labs ordered are listed, but only abnormal results are displayed) Labs Reviewed  COMPREHENSIVE METABOLIC PANEL WITH GFR - Abnormal; Notable for the following components:      Result Value   Potassium 3.0 (*)    CO2 21 (*)    Glucose, Bld 110 (*)    All other components within normal limits  LIPASE, BLOOD - Abnormal; Notable for the following components:   Lipase 80 (*)    All other components within normal limits  URINALYSIS, ROUTINE W REFLEX MICROSCOPIC - Abnormal; Notable for the following components:   Hgb urine dipstick MODERATE (*)    Protein, ur 100 (*)    All other components within normal limits  URINALYSIS, MICROSCOPIC (REFLEX) - Abnormal; Notable for the following components:   Bacteria, UA RARE (*)    All other components within normal limits  CBC WITH DIFFERENTIAL/PLATELET  PREGNANCY, URINE    EKG: None  Radiology: No results found.   Procedures   Medications Ordered in the ED  oxyCODONE  (Oxy IR/ROXICODONE ) immediate release tablet 5 mg (5 mg Oral Given 01/25/24 1451)  ketorolac  (TORADOL ) 15 MG/ML injection 15 mg (15 mg Intravenous Given 01/25/24 1552)  potassium chloride  SA (KLOR-CON  M) CR tablet 40 mEq (40 mEq Oral Given 01/25/24 1552)   ED Course  Patient seen and examined. History obtained directly from patient.  Reviewed previous ED notes from December 2025, previous CT imaging, psychiatric eval from August 2025.  Labs/EKG: Ordered CBC, CMP, lipase, UA, pregnancy.  Imaging: None ordered  Medications/Fluids: Ordered: P.o. oxycodone   Most recent vital signs reviewed and are as follows: BP (!) 117/90 (BP Location: Left Arm)   Pulse (!) 105   Temp 98.6 F (37 C)   Resp 20   SpO2 99%   Initial impression: Pain after ureteral stone procedure performed last month.  Unclear etiology.  Workup in the interim has been reassuring.  3:49  PM Reassessment performed. Patient appears stable, no distress.   Labs personally reviewed and interpreted including: CBC unremarkable with normal white blood cell count and hemoglobin; CMP with chronic hypokalemia, slightly worse than her baseline recently, oral repletion ordered; lipase at 80, was most recently in the 50s; pregnancy negative; UA not a clean-catch, few white cells and red cells noted.   Reviewed pertinent lab work and imaging with patient at bedside. Questions answered.  We discussed need to decrease reliance on narcotic pain medications. Will give #4 MSIR 15mg  to use for severe pain in case of breakthrough.  Will give a  dose of Toradol  and p.o. potassium here prior to discharge.  Recommended trial of Pyridium  but patient states that she cannot currently afford this or buy over-the-counter.  Most current vital signs reviewed and are as follows: BP (!) 117/90 (BP Location: Left Arm)   Pulse (!) 105   Temp 98.6 F (37 C)   Resp 20   SpO2 99%   Plan: Discharge to home.   Prescriptions written for: # 4 tablets morphine , Pyridium   Other home care instructions discussed: Continue NSAIDs  ED return instructions discussed: The patient was urged to return to the Emergency Department immediately with worsening of current symptoms, worsening abdominal pain, persistent vomiting, blood noted in stools, fever, or any other concerns. The patient verbalized understanding.   Follow-up instructions discussed: Patient encouraged to follow-up with their urologist as planned.                                   Medical Decision Making Amount and/or Complexity of Data Reviewed Labs: ordered.  Risk Prescription drug management.   For this patient's complaint of abdominal pain, the following conditions were considered on the differential diagnosis: gastritis/PUD, enteritis/duodenitis, appendicitis, cholelithiasis/cholecystitis, cholangitis, pancreatitis, ruptured viscus, colitis,  diverticulitis, small/large bowel obstruction, proctitis, cystitis, pyelonephritis, ureteral colic, aortic dissection, aortic aneurysm. In women, ectopic pregnancy, pelvic inflammatory disease, ovarian cysts, and tubo-ovarian abscess were also considered. Atypical chest etiologies were also considered including ACS, PE, and pneumonia.  Patient now 2-3 weeks out from urologic procedure.  Unlikely that this is due to continued kidney stone as CT performed about 10 days ago was reassuring.  Labs appear stable today.  I do not feel that she requires advanced imaging given her well appearance.  Unclear etiology of her pain, but do not feel that she requires transfer for emergent urologic consultation or admission at this time.  She has follow-up next week.  We discussed preference to not rely on opioid pain medications.  I will prescribe # 4 tablets MSIR for breakthrough pain, but she should continue celecoxib for primary pain control.  May try Pyridium  if she can obtain this.  Discussed signs and symptoms which should cause her to return.  The patient's vital signs, pertinent lab work and imaging were reviewed and interpreted as discussed in the ED course. Hospitalization was considered for further testing, treatments, or serial exams/observation. However as patient is well-appearing, has a stable exam, and reassuring studies today, I do not feel that they warrant admission at this time. This plan was discussed with the patient who verbalizes agreement and comfort with this plan and seems reliable and able to return to the Emergency Department with worsening or changing symptoms.        Final diagnoses:  Lower abdominal pain  Dysuria  Hypokalemia    ED Discharge Orders          Ordered    morphine  (MSIR) 15 MG tablet  Every 6 hours PRN        01/25/24 1558    phenazopyridine  (PYRIDIUM ) 200 MG tablet  3 times daily        01/25/24 1558               Desiderio Chew, PA-C 01/25/24  1559  "

## 2024-01-25 NOTE — ED Triage Notes (Signed)
 Continued vaginal pain, dysuria since kidney stone surgery on 12/22. No relief from abx or prescribed NSAIDs.   Denies fevers

## 2024-01-25 NOTE — Discharge Instructions (Addendum)
 Please read and follow all provided instructions.  Your diagnoses today include:  1. Lower abdominal pain   2. Dysuria   3. Hypokalemia     Tests performed today include: Complete blood cell count: Was completely normal Complete metabolic panel: Showed low potassium at 3.0 Lipase (pancreas function test): Was mildly elevated at 80 Urinalysis (urine test): Showed some blood and a few infection fighting cells Pregnancy test (urine or blood, in women only): Negative Vital signs. See below for your results today.   Medications prescribed:  Morphine  IR: Medication to use for severe breakthrough pain  Pyridium  - medication for urinary tract infection symptoms.   This medication will turn your urine orange. This is normal.   Take any prescribed medications only as directed.  Home care instructions:  Follow any educational materials contained in this packet.  Follow-up instructions: Please follow-up with your urologist as planned.  Return instructions:  SEEK IMMEDIATE MEDICAL ATTENTION IF: The pain does not go away or becomes severe  A temperature above 101F develops  Repeated vomiting occurs (multiple episodes)  The pain becomes localized to portions of the abdomen. The right side could possibly be appendicitis. In an adult, the left lower portion of the abdomen could be colitis or diverticulitis.  Blood is being passed in stools or vomit (bright red or black tarry stools)  You develop chest pain, difficulty breathing, dizziness or fainting, or become confused, poorly responsive, or inconsolable (young children) If you have any other emergent concerns regarding your health  Additional Information: Abdominal (belly) pain can be caused by many things. Your caregiver performed an examination and possibly ordered blood/urine tests and imaging (CT scan, x-rays, ultrasound). Many cases can be observed and treated at home after initial evaluation in the emergency department. Even though  you are being discharged home, abdominal pain can be unpredictable. Therefore, you need a repeated exam if your pain does not resolve, returns, or worsens. Most patients with abdominal pain don't have to be admitted to the hospital or have surgery, but serious problems like appendicitis and gallbladder attacks can start out as nonspecific pain. Many abdominal conditions cannot be diagnosed in one visit, so follow-up evaluations are very important.  Your vital signs today were: BP (!) 117/90 (BP Location: Left Arm)   Pulse (!) 105   Temp 98.6 F (37 C)   Resp 20   SpO2 99%  If your blood pressure (bp) was elevated above 135/85 this visit, please have this repeated by your doctor within one month. --------------

## 2024-02-06 ENCOUNTER — Other Ambulatory Visit: Payer: Self-pay

## 2024-02-06 ENCOUNTER — Emergency Department (HOSPITAL_BASED_OUTPATIENT_CLINIC_OR_DEPARTMENT_OTHER)
Admission: EM | Admit: 2024-02-06 | Discharge: 2024-02-06 | Disposition: A | Discharge status: Home / Self Care | Attending: Emergency Medicine | Admitting: Emergency Medicine

## 2024-02-06 ENCOUNTER — Encounter (HOSPITAL_BASED_OUTPATIENT_CLINIC_OR_DEPARTMENT_OTHER): Payer: Self-pay | Admitting: Emergency Medicine

## 2024-02-06 DIAGNOSIS — R10A1 Flank pain, right side: Secondary | ICD-10-CM | POA: Diagnosis present

## 2024-02-06 DIAGNOSIS — Z87891 Personal history of nicotine dependence: Secondary | ICD-10-CM | POA: Insufficient documentation

## 2024-02-06 DIAGNOSIS — Z859 Personal history of malignant neoplasm, unspecified: Secondary | ICD-10-CM | POA: Diagnosis not present

## 2024-02-06 LAB — URINALYSIS, ROUTINE W REFLEX MICROSCOPIC
Specific Gravity, Urine: 1.01 (ref 1.005–1.030)
pH: 5 (ref 5.0–8.0)

## 2024-02-06 LAB — URINALYSIS, MICROSCOPIC (REFLEX)

## 2024-02-06 LAB — PREGNANCY, URINE: Preg Test, Ur: NEGATIVE

## 2024-02-06 MED ORDER — OXYCODONE HCL 5 MG PO TABS: 5.0000 mg | ORAL_TABLET | Freq: Four times a day (QID) | ORAL | 0 refills | Status: AC | PRN

## 2024-02-06 MED ORDER — ONDANSETRON 4 MG PO TBDP: 4.0000 mg | ORAL_TABLET | Freq: Three times a day (TID) | ORAL | 0 refills | Status: AC | PRN

## 2024-02-06 MED ORDER — CIPROFLOXACIN HCL 500 MG PO TABS: 500.0000 mg | ORAL_TABLET | Freq: Two times a day (BID) | ORAL | 0 refills | Status: AC

## 2024-02-06 MED ORDER — KETOROLAC TROMETHAMINE 30 MG/ML IJ SOLN
30.0000 mg | Freq: Once | INTRAMUSCULAR | Status: AC
  Administered 2024-02-06: 30 mg via INTRAMUSCULAR
  Filled 2024-02-06: qty 1

## 2024-02-06 MED ORDER — SENNOSIDES-DOCUSATE SODIUM 8.6-50 MG PO TABS: 1.0000 | ORAL_TABLET | Freq: Every evening | ORAL | 0 refills | Status: AC | PRN

## 2024-02-06 NOTE — ED Notes (Signed)
 Report received from [place], [place]. Assuming patient care at this time.

## 2024-02-06 NOTE — ED Notes (Signed)
 Patient requesting pain medication. Provider notified. Pending new orders.
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
## 2024-02-06 NOTE — Discharge Instructions (Signed)
 I am starting you back on antibiotics and have sent your urine for culture.  We will call if you need to change antibiotics.  I am also calling in some pain medications.  I will have you restart your Flomax  from your prior kidney stone and call the urologist for close follow-up in the coming week.  If your pain symptoms are unable to be controlled at home or specially if you develop high fevers you should return to the emergency department for reevaluation.

## 2024-02-06 NOTE — ED Triage Notes (Signed)
 Pt c/o R flank pain x 1 hour. Denies hematuria, currently taking AZO's due to ongoing kidney issues from previous surgery.   Denies fever.

## 2024-02-07 LAB — URINE CULTURE: Culture: NO GROWTH
